# Patient Record
Sex: Female | Born: 1995 | Race: White | Hispanic: No | Marital: Single | Smoking: Current every day smoker
Health system: Southern US, Community
[De-identification: ages and names within clinical notes are randomized; demographics above are authoritative.]

## PROBLEM LIST (undated history)

## (undated) DIAGNOSIS — K219 Gastro-esophageal reflux disease without esophagitis: Secondary | ICD-10-CM

## (undated) DIAGNOSIS — E162 Hypoglycemia, unspecified: Secondary | ICD-10-CM

## (undated) DIAGNOSIS — J189 Pneumonia, unspecified organism: Secondary | ICD-10-CM

## (undated) DIAGNOSIS — B159 Hepatitis A without hepatic coma: Secondary | ICD-10-CM

## (undated) DIAGNOSIS — F909 Attention-deficit hyperactivity disorder, unspecified type: Secondary | ICD-10-CM

## (undated) DIAGNOSIS — G2581 Restless legs syndrome: Secondary | ICD-10-CM

## (undated) DIAGNOSIS — H9325 Central auditory processing disorder: Secondary | ICD-10-CM

## (undated) DIAGNOSIS — M797 Fibromyalgia: Secondary | ICD-10-CM

---
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
## 2013-04-28 ENCOUNTER — Encounter (HOSPITAL_BASED_OUTPATIENT_CLINIC_OR_DEPARTMENT_OTHER): Payer: Self-pay | Admitting: Emergency Medicine

## 2013-04-28 ENCOUNTER — Emergency Department (HOSPITAL_BASED_OUTPATIENT_CLINIC_OR_DEPARTMENT_OTHER)
Admission: EM | Admit: 2013-04-28 | Discharge: 2013-04-28 | Disposition: A | Discharge status: Home / Self Care | Payer: Medicaid Other | Attending: Emergency Medicine | Admitting: Emergency Medicine

## 2013-04-28 DIAGNOSIS — Z79899 Other long term (current) drug therapy: Secondary | ICD-10-CM | POA: Insufficient documentation

## 2013-04-28 DIAGNOSIS — Z87891 Personal history of nicotine dependence: Secondary | ICD-10-CM | POA: Insufficient documentation

## 2013-04-28 DIAGNOSIS — Z8619 Personal history of other infectious and parasitic diseases: Secondary | ICD-10-CM | POA: Insufficient documentation

## 2013-04-28 DIAGNOSIS — Z862 Personal history of diseases of the blood and blood-forming organs and certain disorders involving the immune mechanism: Secondary | ICD-10-CM | POA: Insufficient documentation

## 2013-04-28 DIAGNOSIS — N390 Urinary tract infection, site not specified: Secondary | ICD-10-CM | POA: Insufficient documentation

## 2013-04-28 DIAGNOSIS — Z8659 Personal history of other mental and behavioral disorders: Secondary | ICD-10-CM | POA: Insufficient documentation

## 2013-04-28 DIAGNOSIS — Z8719 Personal history of other diseases of the digestive system: Secondary | ICD-10-CM | POA: Insufficient documentation

## 2013-04-28 DIAGNOSIS — Z3202 Encounter for pregnancy test, result negative: Secondary | ICD-10-CM | POA: Insufficient documentation

## 2013-04-28 DIAGNOSIS — R059 Cough, unspecified: Secondary | ICD-10-CM | POA: Insufficient documentation

## 2013-04-28 DIAGNOSIS — Z792 Long term (current) use of antibiotics: Secondary | ICD-10-CM | POA: Insufficient documentation

## 2013-04-28 DIAGNOSIS — F411 Generalized anxiety disorder: Secondary | ICD-10-CM | POA: Insufficient documentation

## 2013-04-28 DIAGNOSIS — Z8639 Personal history of other endocrine, nutritional and metabolic disease: Secondary | ICD-10-CM | POA: Insufficient documentation

## 2013-04-28 DIAGNOSIS — R05 Cough: Secondary | ICD-10-CM | POA: Insufficient documentation

## 2013-04-28 DIAGNOSIS — Z8701 Personal history of pneumonia (recurrent): Secondary | ICD-10-CM | POA: Insufficient documentation

## 2013-04-28 DIAGNOSIS — G2581 Restless legs syndrome: Secondary | ICD-10-CM | POA: Insufficient documentation

## 2013-04-28 HISTORY — DX: Restless legs syndrome: G25.81

## 2013-04-28 LAB — URINALYSIS, ROUTINE W REFLEX MICROSCOPIC
BILIRUBIN URINE: NEGATIVE
Glucose, UA: NEGATIVE mg/dL
Hgb urine dipstick: NEGATIVE
KETONES UR: NEGATIVE mg/dL
Nitrite: NEGATIVE
PH: 6 (ref 5.0–8.0)
PROTEIN: NEGATIVE mg/dL
Specific Gravity, Urine: 1.03 (ref 1.005–1.030)
Urobilinogen, UA: 1 mg/dL (ref 0.0–1.0)

## 2013-04-28 LAB — URINE MICROSCOPIC-ADD ON

## 2013-04-28 LAB — PREGNANCY, URINE: Preg Test, Ur: NEGATIVE

## 2013-04-28 MED ORDER — LORAZEPAM 1 MG PO TABS: 1.0000 mg | ORAL_TABLET | Freq: Three times a day (TID) | ORAL | Status: DC | PRN

## 2013-04-28 MED ORDER — NITROFURANTOIN MONOHYD MACRO 100 MG PO CAPS
100.0000 mg | ORAL_CAPSULE | Freq: Once | ORAL | Status: AC
  Administered 2013-04-28: 100 mg via ORAL
  Filled 2013-04-28: qty 1

## 2013-04-28 MED ORDER — NITROFURANTOIN MONOHYD MACRO 100 MG PO CAPS: 100.0000 mg | ORAL_CAPSULE | Freq: Two times a day (BID) | ORAL | Status: DC

## 2013-04-28 NOTE — ED Notes (Signed)
Pt c/o restless arms/legs, unable to control movements, hx of same, states started tonight, denies pain

## 2013-04-28 NOTE — ED Provider Notes (Addendum)
CSN: [id number]     Arrival date & time 04/28/13  0536 History   None    Chief Complaint  Patient presents with  . Leg Problem    restless arms/legs     (Consider location/radiation/quality/duration/timing/severity/associated sxs/prior Treatment) HPI This is a 18 year old female with a history of restless leg syndrome. She has episodes that occur several times a year. These episodes occurred night primarily. It consists of an uncontrollable urge to move her arms and legs. This is associated with anxiety and a feeling of the knees. Her current episode began a day ago but worsened this morning. It is severe enough to keep her from being able to sleep. The symptoms are like previous episodes. She is not sure what triggers these episodes but states she is known to have a low iron level and is taking iron supplements. She has not had a ferritin level checked recently that she is aware of. She denies fever, chills, shortness of breath, nausea, vomiting, diarrhea, dysuria, vaginal bleeding or vaginal discharge. She was recently treated with hydrocodone/homatropine syrup for cough.  Past Medical History  Diagnosis Date  . Attention deficit hyperactivity disorder (ADHD)   . Hypoglycemia   . Central auditory processing disorder   . GERD (gastroesophageal reflux disease)   . Hepatitis A   . Pneumonia   . Restless leg syndrome    History reviewed. No pertinent past surgical history. No family history on file. History  Substance Use Topics  . Smoking status: Former Games developer  . Smokeless tobacco: Not on file  . Alcohol Use: No   OB History   Grav Para Term Preterm Abortions TAB SAB Ect Mult Living   1              Review of Systems  All other systems reviewed and are negative.   Allergies  Sulfa antibiotics  Home Medications   Current Outpatient Rx  Name  Route  Sig  Dispense  Refill  . ALPRAZolam (XANAX) 0.5 MG tablet   Oral   Take 0.5 mg by mouth 2 (two) times daily as needed for  anxiety.         . cephALEXin (KEFLEX) 500 MG capsule   Oral   Take 1 capsule (500 mg total) by mouth 4 (four) times daily.   40 capsule   0   . escitalopram (LEXAPRO) 20 MG tablet   Oral   Take 20 mg by mouth daily.         . metroNIDAZOLE (FLAGYL) 500 MG tablet   Oral   Take 1 tablet (500 mg total) by mouth 2 (two) times daily.   14 tablet   0   . naproxen (NAPROSYN) 500 MG tablet   Oral   Take 1 tablet (500 mg total) by mouth 2 (two) times daily.   30 tablet   0    BP 105/55  Pulse 76  Temp(Src) 99.8 F (37.7 C) (Oral)  Resp 18  SpO2 100%  LMP 04/14/2013  Breastfeeding? Unknown  Physical Exam General: Well-developed, well-nourished female in no acute distress; appearance consistent with age of record HENT: normocephalic; atraumatic Eyes: pupils equal, dilated, round and reactive to light; extraocular muscles intact Neck: supple Heart: regular rate and rhythm Lungs: clear to auscultation bilaterally Abdomen: soft; nondistended; nontender; no masses or hepatosplenomegaly; bowel sounds present Extremities: No deformity; full range of motion; pulses normal; no muscle tenderness; no muscle spasm palpated Neurologic: Awake, alert and oriented; motor function intact in all extremities and  symmetric; no facial droop; normal coordination, speech and gait Skin: Warm and dry Psychiatric: Normal mood and affect    ED Course  Procedures (including critical care time)  MDM   Nursing notes and vitals signs, including pulse oximetry, reviewed.  Summary of this visit's results, reviewed by myself:  Labs:  Results for orders placed during the hospital encounter of 04/28/13 (from the past 24 hour(s))  PREGNANCY, URINE     Status: None   Collection Time    04/28/13  6:01 AM      Result Value Ref Range   Preg Test, Ur NEGATIVE  NEGATIVE  URINALYSIS, ROUTINE W REFLEX MICROSCOPIC     Status: Abnormal   Collection Time    04/28/13  6:01 AM      Result Value Ref  Range   Color, Urine YELLOW  YELLOW   APPearance CLOUDY (*) CLEAR   Specific Gravity, Urine 1.030  1.005 - 1.030   pH 6.0  5.0 - 8.0   Glucose, UA NEGATIVE  NEGATIVE mg/dL   Hgb urine dipstick NEGATIVE  NEGATIVE   Bilirubin Urine NEGATIVE  NEGATIVE   Ketones, ur NEGATIVE  NEGATIVE mg/dL   Protein, ur NEGATIVE  NEGATIVE mg/dL   Urobilinogen, UA 1.0  0.0 - 1.0 mg/dL   Nitrite NEGATIVE  NEGATIVE   Leukocytes, UA SMALL (*) NEGATIVE  URINE MICROSCOPIC-ADD ON     Status: Abnormal   Collection Time    04/28/13  6:01 AM      Result Value Ref Range   Squamous Epithelial / LPF MANY (*) RARE   WBC, UA 7-10  <3 WBC/hpf   RBC / HPF 0-2  <3 RBC/hpf   Bacteria, UA MANY (*) RARE   Urine-Other MUCOUS PRESENT        [facility] the [facility] [place] Cont[place] Substances Database the patient has only had 2 prescriptions for benzodiazepines in the past year.   She has ever had a formal assessment for restless legs. Her symptoms may be exacerbated by her urinary tract infection. We will refer to neurology.    Hanley SeamenJohn L Laretta Pyatt, MD 04/28/13 [id number]  Hanley SeamenJohn L Jessalyn Hinojosa, MD 04/28/13 [id number]  Hanley SeamenJohn L Melena Hayes, MD 04/28/13 [phone]

## 2013-04-29 LAB — URINE CULTURE: Colony Count: 90000

## 2013-07-30 ENCOUNTER — Encounter (HOSPITAL_BASED_OUTPATIENT_CLINIC_OR_DEPARTMENT_OTHER): Payer: Self-pay | Admitting: Emergency Medicine

## 2013-07-30 ENCOUNTER — Emergency Department (HOSPITAL_BASED_OUTPATIENT_CLINIC_OR_DEPARTMENT_OTHER)
Admission: EM | Admit: 2013-07-30 | Discharge: 2013-07-30 | Disposition: A | Discharge status: Home / Self Care | Payer: Medicaid Other | Attending: Emergency Medicine | Admitting: Emergency Medicine

## 2013-07-30 DIAGNOSIS — K029 Dental caries, unspecified: Secondary | ICD-10-CM | POA: Insufficient documentation

## 2013-07-30 DIAGNOSIS — Z87891 Personal history of nicotine dependence: Secondary | ICD-10-CM | POA: Diagnosis not present

## 2013-07-30 DIAGNOSIS — K089 Disorder of teeth and supporting structures, unspecified: Secondary | ICD-10-CM | POA: Diagnosis present

## 2013-07-30 DIAGNOSIS — Z792 Long term (current) use of antibiotics: Secondary | ICD-10-CM | POA: Diagnosis not present

## 2013-07-30 DIAGNOSIS — Z8669 Personal history of other diseases of the nervous system and sense organs: Secondary | ICD-10-CM | POA: Insufficient documentation

## 2013-07-30 DIAGNOSIS — Z8701 Personal history of pneumonia (recurrent): Secondary | ICD-10-CM | POA: Diagnosis not present

## 2013-07-30 DIAGNOSIS — Z8619 Personal history of other infectious and parasitic diseases: Secondary | ICD-10-CM | POA: Insufficient documentation

## 2013-07-30 DIAGNOSIS — Z79899 Other long term (current) drug therapy: Secondary | ICD-10-CM | POA: Insufficient documentation

## 2013-07-30 DIAGNOSIS — F909 Attention-deficit hyperactivity disorder, unspecified type: Secondary | ICD-10-CM | POA: Insufficient documentation

## 2013-07-30 DIAGNOSIS — Z791 Long term (current) use of non-steroidal anti-inflammatories (NSAID): Secondary | ICD-10-CM | POA: Insufficient documentation

## 2013-07-30 DIAGNOSIS — K0889 Other specified disorders of teeth and supporting structures: Secondary | ICD-10-CM

## 2013-07-30 MED ORDER — NAPROXEN 500 MG PO TABS: 500.0000 mg | ORAL_TABLET | Freq: Two times a day (BID) | ORAL | Status: DC

## 2013-07-30 MED ORDER — HYDROCODONE-ACETAMINOPHEN 5-325 MG PO TABS
1.0000 | ORAL_TABLET | Freq: Four times a day (QID) | ORAL | Status: DC | PRN
Start: 2013-07-30 — End: 2018-03-17

## 2013-07-30 MED ORDER — PENICILLIN V POTASSIUM 500 MG PO TABS: 500.0000 mg | ORAL_TABLET | Freq: Four times a day (QID) | ORAL | Status: DC

## 2013-07-30 NOTE — ED Notes (Signed)
Introduced myself to patient. Waiting on MD.

## 2013-07-30 NOTE — ED Provider Notes (Signed)
CSN: [id number]     Arrival date & time 07/30/13  1811 History  This chart was scribed for Vanetta MuldersScott Jaycie Kregel, MD by Evon Slackerrance Branch, ED Scribe. This patient was seen in room MH11/MH11 and the patient's care was started at 9:10 PM.     Chief Complaint  Patient presents with  . Dental Pain   Patient is a 18 y.o. female presenting with tooth pain. The history is provided by the patient. No language interpreter was used.  Dental Pain Location:  Upper Quality:  Throbbing Severity:  Moderate Onset quality:  Gradual Duration:  2 days Timing:  Constant Context: dental caries   Relieved by:  None tried Worsened by:  Nothing tried Ineffective treatments:  None tried Associated symptoms: no fever, no headaches and no neck pain    HPI Comments: Ariel Spears is a 18 y.o. female who presents to the Emergency Department complaining of throbbing upper dental pain onset 2 days prior. She states she hasn't taken any medication prior to arrival. She states that her pain severity is 9/10. She states she has some associated chills. She states she has a dental appointment Monday. She denies fever chills, rhinorrhea, cough, sore throat, visual disturbance, SOB, chest pain, nausea, vomiting, abdominal pain, diarrhea, neck pain, back pain, or headaches.      Past Medical History  Diagnosis Date  . Attention deficit hyperactivity disorder (ADHD)   . Hypoglycemia   . Central auditory processing disorder   . GERD (gastroesophageal reflux disease)   . Hepatitis A   . Pneumonia   . Restless leg syndrome    History reviewed. No pertinent past surgical history. No family history on file. History  Substance Use Topics  . Smoking status: Former Games developermoker  . Smokeless tobacco: Not on file  . Alcohol Use: No   OB History   Grav Para Term Preterm Abortions TAB SAB Ect Mult Living   1              Review of Systems  Constitutional: Positive for chills. Negative for fever.  HENT: Negative for  rhinorrhea and sore throat.   Eyes: Negative for visual disturbance.  Respiratory: Negative for cough and shortness of breath.   Cardiovascular: Negative for chest pain.  Gastrointestinal: Negative for nausea, vomiting, abdominal pain and diarrhea.  Musculoskeletal: Negative for back pain and neck pain.  Neurological: Negative for headaches.  Psychiatric/Behavioral: Negative for confusion.    Allergies  Sulfa antibiotics  Home Medications   Prior to Admission medications   Medication Sig Start Date End Date Taking? Authorizing Provider  Lisdexamfetamine Dimesylate (VYVANSE PO) Take by mouth.   Yes Historical Provider, MD  ALPRAZolam Prudy Feeler(XANAX) 0.5 MG tablet Take 0.5 mg by mouth 2 (two) times daily as needed for anxiety.    Historical Provider, MD  cephALEXin (KEFLEX) 500 MG capsule Take 1 capsule (500 mg total) by mouth 4 (four) times daily. 09/06/12   Marissa Sciacca, PA-C  escitalopram (LEXAPRO) 20 MG tablet Take 20 mg by mouth daily.    Historical Provider, MD  HYDROcodone-acetaminophen (NORCO/VICODIN) 5-325 MG per tablet Take 1-2 tablets by mouth every 6 (six) hours as needed for moderate pain. 07/30/13   Vanetta MuldersScott Lashunta Frieden, MD  LORazepam (ATIVAN) 1 MG tablet Take 1 tablet (1 mg total) by mouth 3 (three) times daily as needed (for anxiety or restless legs). 04/28/13   John L Molpus, MD  metroNIDAZOLE (FLAGYL) 500 MG tablet Take 1 tablet (500 mg total) by mouth 2 (two) times daily. 12/17/12  Elenora GammaSamuel L Bradshaw, MD  naproxen (NAPROSYN) 500 MG tablet Take 1 tablet (500 mg total) by mouth 2 (two) times daily. 09/06/12   Marissa Sciacca, PA-C  naproxen (NAPROSYN) 500 MG tablet Take 1 tablet (500 mg total) by mouth 2 (two) times daily. 07/30/13   Vanetta MuldersScott Krew Hortman, MD  nitrofurantoin, macrocrystal-monohydrate, (MACROBID) 100 MG capsule Take 1 capsule (100 mg total) by mouth 2 (two) times daily. X 7 days 04/28/13   Carlisle BeersJohn L Molpus, MD  penicillin v potassium (VEETID) 500 MG tablet Take 1 tablet (500 mg total) by  mouth 4 (four) times daily. 07/30/13   Vanetta MuldersScott Fabricio Endsley, MD   Triage Vitals: BP 112/76  Pulse 96  Temp(Src) 98.3 F (36.8 C) (Oral)  Resp 16  Ht 5' 7.5" (1.715 m)  Wt 140 lb (63.504 kg)  BMI 21.59 kg/m2  SpO2 99%  LMP 07/16/2013  Physical Exam  Nursing note and vitals reviewed. Constitutional: She is oriented to person, place, and time. She appears well-developed and well-nourished. No distress.  HENT:  Head: Normocephalic and atraumatic.  Mouth/Throat: Dental caries present.  Dental carie in last molar on upper left side  Eyes: Conjunctivae and EOM are normal.  Neck: Neck supple. No tracheal deviation present.  Cardiovascular: Normal rate, regular rhythm and normal heart sounds.   Pulmonary/Chest: Effort normal and breath sounds normal. No respiratory distress.  Abdominal: Bowel sounds are normal. There is no tenderness.  Musculoskeletal: Normal range of motion.  Neurological: She is alert and oriented to person, place, and time. No cranial nerve deficit. She exhibits normal muscle tone. Coordination normal.  Skin: Skin is warm and dry.  Psychiatric: She has a normal mood and affect. Her behavior is normal.    ED Course  Procedures (including critical care time) DIAGNOSTIC STUDIES: Oxygen Saturation is 99% on RA, normal by my interpretation.    COORDINATION OF CARE: 9:16 PM-Discussed treatment plan which includes pain medication with pt at bedside and pt agreed to plan.     Labs Review Labs Reviewed - No data to display  Imaging Review No results found.   EKG Interpretation None      MDM   Final diagnoses:  Toothache    Patient with left upper molar with a lot of decay. The tenderness to palpation. Will treat with antibiotic anti-inflammatory and pain medicine and followup with dentist. Patient is nontoxic no acute distress.    I personally performed the services described in this documentation, which was scribed in my presence. The recorded information has  been reviewed and is accurate.       Vanetta MuldersScott Ashyia Schraeder, MD 07/30/13 2121

## 2013-07-30 NOTE — ED Notes (Signed)
MD at bedside. 

## 2013-07-30 NOTE — ED Notes (Signed)
Dental pain since yesterday.  

## 2013-07-30 NOTE — Discharge Instructions (Signed)
Dental Pain Toothache is pain in or around a tooth. It may get worse with chewing or with cold or heat.  HOME CARE  Your dentist may use a numbing medicine during treatment. If so, you may need to avoid eating until the medicine wears off. Ask your dentist about this.  Only take medicine as told by your dentist or doctor.  Avoid chewing food near the painful tooth until after all treatment is done. Ask your dentist about this. GET HELP RIGHT AWAY IF:   The problem gets worse or new problems appear.  You have a fever.  There is redness and puffiness (swelling) of the face, jaw, or neck.  You cannot open your mouth.  There is pain in the jaw.  There is very bad pain that is not helped by medicine. MAKE SURE YOU:   Understand these instructions.  Will watch your condition.  Will get help right away if you are not doing well or get worse. Document Released: 07/03/2007 Document Revised: 04/08/2011 Document Reviewed: 07/03/2007 [facility] Patient Information 2015 [employer] [place]. This information is not intended to replace advice given to you by your health care provider. Make sure you discuss any questions you have with your health care provider. Take medications as directed. Followup with dentist next week. Return for any new or worse symptoms.

## 2013-10-20 ENCOUNTER — Emergency Department (HOSPITAL_COMMUNITY)
Admission: EM | Admit: 2013-10-20 | Discharge: 2013-10-20 | Disposition: A | Discharge status: Home / Self Care | Payer: Medicaid Other | Attending: Emergency Medicine | Admitting: Emergency Medicine

## 2013-10-20 ENCOUNTER — Encounter (HOSPITAL_COMMUNITY): Payer: Self-pay | Admitting: Emergency Medicine

## 2013-10-20 DIAGNOSIS — Z87891 Personal history of nicotine dependence: Secondary | ICD-10-CM | POA: Insufficient documentation

## 2013-10-20 DIAGNOSIS — Z8701 Personal history of pneumonia (recurrent): Secondary | ICD-10-CM | POA: Diagnosis not present

## 2013-10-20 DIAGNOSIS — F411 Generalized anxiety disorder: Secondary | ICD-10-CM | POA: Insufficient documentation

## 2013-10-20 DIAGNOSIS — Z8669 Personal history of other diseases of the nervous system and sense organs: Secondary | ICD-10-CM | POA: Insufficient documentation

## 2013-10-20 DIAGNOSIS — Z8619 Personal history of other infectious and parasitic diseases: Secondary | ICD-10-CM | POA: Diagnosis not present

## 2013-10-20 DIAGNOSIS — Z862 Personal history of diseases of the blood and blood-forming organs and certain disorders involving the immune mechanism: Secondary | ICD-10-CM | POA: Diagnosis not present

## 2013-10-20 DIAGNOSIS — F19939 Other psychoactive substance use, unspecified with withdrawal, unspecified: Principal | ICD-10-CM | POA: Insufficient documentation

## 2013-10-20 DIAGNOSIS — F1123 Opioid dependence with withdrawal: Secondary | ICD-10-CM

## 2013-10-20 DIAGNOSIS — F909 Attention-deficit hyperactivity disorder, unspecified type: Secondary | ICD-10-CM

## 2013-10-20 DIAGNOSIS — Z8639 Personal history of other endocrine, nutritional and metabolic disease: Secondary | ICD-10-CM | POA: Insufficient documentation

## 2013-10-20 DIAGNOSIS — Z791 Long term (current) use of non-steroidal anti-inflammatories (NSAID): Secondary | ICD-10-CM | POA: Insufficient documentation

## 2013-10-20 DIAGNOSIS — F1120 Opioid dependence, uncomplicated: Secondary | ICD-10-CM | POA: Insufficient documentation

## 2013-10-20 DIAGNOSIS — Z79899 Other long term (current) drug therapy: Secondary | ICD-10-CM | POA: Insufficient documentation

## 2013-10-20 DIAGNOSIS — Z792 Long term (current) use of antibiotics: Secondary | ICD-10-CM | POA: Insufficient documentation

## 2013-10-20 DIAGNOSIS — Z8719 Personal history of other diseases of the digestive system: Secondary | ICD-10-CM | POA: Insufficient documentation

## 2013-10-20 MED ORDER — DICYCLOMINE HCL 20 MG PO TABS: 20.0000 mg | ORAL_TABLET | Freq: Two times a day (BID) | ORAL | Status: DC

## 2013-10-20 MED ORDER — ONDANSETRON HCL 4 MG PO TABS: 4.0000 mg | ORAL_TABLET | Freq: Four times a day (QID) | ORAL | Status: DC

## 2013-10-20 MED ORDER — CLONIDINE HCL 0.2 MG PO TABS: 0.2000 mg | ORAL_TABLET | Freq: Three times a day (TID) | ORAL | Status: DC

## 2013-10-20 NOTE — ED Provider Notes (Signed)
Medical screening examination/treatment/procedure(s) were performed by non-physician practitioner and as supervising physician I was immediately available for consultation/collaboration.   EKG Interpretation None      Mertha Clyatt, MD, ADevoria Albe Gang   Ward Givens, MD 10/20/13 [phone]

## 2013-10-20 NOTE — ED Notes (Signed)
Pt reports at one point she was taking pain pills for her tooth, also takes vyvanse, reports she felt normal taking vyanse and pain pills. Pt started taking suboxone 3 weeks ago to make herself feel normal. Pt wants to stop doing suboxone. Denies pain. Reports during withdrawal she starts getting sweaty and cold. Last suboxone use around 12pm.

## 2013-10-20 NOTE — Discharge Instructions (Signed)
Opioid Use Disorder °Opioid use disorder is a mental disorder. It is the continued nonmedical use of opioids in spite of risks to health and well-being. Misused opioids include the street drug heroin. They also include pain medicines such as morphine, hydrocodone, oxycodone, and fentanyl. Opioids are very addictive. People who misuse opioids get an exaggerated feeling of well-being. Opioid use disorder often disrupts activities at home, work, or school. It may cause mental or physical problems.  °A family history of opioid use disorder puts you at higher risk of it. People with opioid use disorder often misuse other drugs or have mental illness such as depression, posttraumatic stress disorder, or antisocial personality disorder. They also are at risk of suicide and death from overdose. °SIGNS AND SYMPTOMS  °Signs and symptoms of opioid use disorder include: °· Use of opioids in larger amounts or over a longer period than intended. °· Unsuccessful attempts to cut down or control opioid use. °· A lot of time spent obtaining, using, or recovering from the effects of opioids. °· A strong desire or urge to use opioids (craving). °· Continued use of opioids in spite of major problems at work, school, or home because of use. °· Continued use of opioids in spite of relationship problems because of use. °· Giving up or cutting down on important life activities because of opioid use. °· Use of opioids over and over in situations when it is physically hazardous, such as driving a car. °· Continued use of opioids in spite of a physical problem that is likely related to use. Physical problems can include: °¨ Severe constipation. °¨ Poor nutrition. °¨ Infertility. °¨ Tuberculosis. °¨ Aspiration pneumonia. °¨ Infections such as human immunodeficiency virus (HIV) and hepatitis (from injecting opioids). °· Continued use of opioids in spite of a mental problem that is likely related to use. Mental problems can  include: °¨ Depression. °¨ Anxiety. °¨ Hallucinations. °¨ Sleep problems. °¨ Loss of sexual function. °· Need to use more and more opioids to get the same effect, or lessened effect over time with use of the same amount (tolerance). °· Having withdrawal symptoms when opioid use is stopped, or using opioids to reduce or avoid withdrawal symptoms. Withdrawal symptoms include: °¨ Depressed, anxious, or irritable mood. °¨ Nausea, vomiting, diarrhea, or intestinal cramping. °¨ Muscle aches or spasms. °¨ Excessive tearing or runny nose. °¨ Dilated pupils, sweating, or hairs standing on end. °¨ Yawning. °¨ Fever, raised blood pressure, or fast pulse. °¨ Restlessness or trouble sleeping. This does not apply to people taking opioids for medical reasons only. °DIAGNOSIS °Opioid use disorder is diagnosed by your health care provider. You may be asked questions about your opioid use and and how it affects your life. A physical exam may be done. A drug screen may be ordered. You may be referred to a mental health professional. The diagnosis of opioid use disorder requires at least two symptoms within 12 months. The type of opioid use disorder you have depends on the number of signs and symptoms you have. The type may be: °· Mild. Two or three signs and symptoms.    °· Moderate. Four or five signs and symptoms.   °· Severe. Six or more signs and symptoms. °TREATMENT  °Treatment is usually provided by mental health professionals with training in substance use disorders. The following options are available: °· Detoxification. This is the first step in treatment for withdrawal. It is medically supervised withdrawal with the use of medicines. These medicines lessen withdrawal symptoms. They also raise the chance   of becoming opioid free.  Counseling, also known as talk therapy. Talk therapy addresses the reasons you use opioids. It also addresses ways to keep you from using again (relapse). The goals of talk therapy are to avoid  relapse by:  Identifying and avoiding triggers for use.  Finding healthy ways to cope with stress.  Learning how to handle cravings.  Support groups. Support groups provide emotional support, advice, and guidance.  A medicine that blocks opioid receptors in your brain. This medicine can reduce opioid cravings that lead to relapse. This medicine also blocks the desired opioid effect when relapse occurs.  Opioids that are taken by mouth in place of the misused opioid (opioid maintenance treatment). These medicines satisfy cravings but are safer than commonly misused opioids. This often is the best option for people who continue to relapse with other treatments. HOME CARE INSTRUCTIONS   Take medicines only as directed by your health care provider.  Check with your health care provider before starting new medicines.  Keep all follow-up visits as directed by your health care provider. SEEK MEDICAL CARE IF:  You are not able to take your medicines as directed.  Your symptoms get worse. SEEK IMMEDIATE MEDICAL CARE IF:  You have serious thoughts about hurting yourself or others.  You may have taken an overdose of opioids. FOR MORE INFORMATION  National Institute on Drug Abuse: http://www.price-smith.com/  Substance Abuse and Mental Health Services Administration: SkateOasis.com.pt Document Released: 11/11/2006 Document Revised: 05/31/2013 Document Reviewed: 01/27/2013 [facility] Patient Information 2015 [place], [place]. This information is not intended to replace advice given to you by your health care provider. Make sure you discuss any questions you have with your health care provider.   Finding Treatment for Alcohol and Drug Addiction It can be hard to find the right place to get professional treatment. Here are some important things to consider:  There are different types of treatment to choose from.  Some programs are live-in (residential) while others are not (outpatient). Sometimes a  combination is offered.  No single type of program is right for everyone.  Most treatment programs involve a combination of education, counseling, and a 12-step, spiritually-based approach.  There are non-spiritually based programs (not 12-step).  Some treatment programs are government sponsored. They are geared for patients without private insurance.  Treatment programs can vary in many respects such as:  Cost and types of insurance accepted.  Types of on-site medical services offered.  Length of stay, setting, and size.  Overall philosophy of treatment. A person may need specialized treatment or have needs not addressed by all programs. For example, adolescents need treatment appropriate for their age. Other people have secondary disorders that must be managed as well. Secondary conditions can include mental illness, such as depression or diabetes. Often, a period of detoxification from alcohol or drugs is needed. This requires medical supervision and not all programs offer this. THINGS TO CONSIDER WHEN SELECTING A TREATMENT PROGRAM   Is the program certified by the appropriate government agency? Even private programs must be certified and employ certified professionals.  Does the program accept your insurance? If not, can a payment plan be set up?  Is the facility clean, organized, and well run? Do they allow you to speak with graduates who can share their treatment experience with you? Can you tour the facility? Can you meet with staff?  Does the program meet the full range of individual needs?  Does the treatment program address sexual orientation and physical disabilities? Do  they provide age, gender, and culturally appropriate treatment services?  Is treatment available in languages other than English?  Is long-term aftercare support or guidance encouraged and provided?  Is assessment of an individual's treatment plan ongoing to ensure it meets changing needs?  Does the  program use strategies to encourage reluctant patients to remain in treatment long enough to increase the likelihood of success?  Does the program offer counseling (individual or group) and other behavioral therapies?  Does the program offer medicine as part of the treatment regimen, if needed?  Is there ongoing monitoring of possible relapse? Is there a defined relapse prevention program? Are services or referrals offered to family members to ensure they understand addiction and the recovery process? This would help them support the recovering individual.  Are 12-step meetings held at the center or is transport available for patients to attend outside meetings? In countries outside of the [place]. and [place], Magazine features editor for contact information for services in your area. Document Released: 12/13/2004 Document Revised: 04/08/2011 Document Reviewed: 06/25/2007 [facility] Patient Information 2015 [place], [place]. This information is not intended to replace advice given to you by your health care provider. Make sure you discuss any questions you have with your health care provider.

## 2013-10-20 NOTE — ED Provider Notes (Signed)
CSN: [id number]     Arrival date & time 10/20/13  1401 History  This chart was scribed for non-physician practitioner, Eben Burow, PA-C, working with Ward Givens, MD by Charline Bills, ED Scribe. This patient was seen in room WTR4/WLPT4 and the patient's care was started at 3:59 PM.   Chief Complaint  Patient presents with  . detox suboxone    The history is provided by the patient. No language interpreter was used.   HPI Comments: Ariel Spears is a 18 y.o. female, with a h/o ADHD, hypoglycemia, hepatitis A, who presents to the Emergency Department for detox from Suboxone. Pt states that she was initially taking pain medication for dental pain. Pt states that she was also taking Vyvanse for ADHD which caused her to feel hot and BP elevation. Pt states that she tried Suboxone with Vyvanse one day and that seemed to relieve her symptoms. Pt states that she has used Suboxone for the past 3 weeks, but not for pain. She expresses her desire to detox. Last dose at 12 PM today. Pt reports associated chills and diaphoresis. She denies diarrhea, nausea, vomiting, SOB.  PCP: Fran Lowes  Past Medical History  Diagnosis Date  . Attention deficit hyperactivity disorder (ADHD)   . Hypoglycemia   . Central auditory processing disorder   . GERD (gastroesophageal reflux disease)   . Hepatitis A   . Pneumonia   . Restless leg syndrome    History reviewed. No pertinent past surgical history. History reviewed. No pertinent family history. History  Substance Use Topics  . Smoking status: Former Games developer  . Smokeless tobacco: Not on file  . Alcohol Use: No   OB History   Grav Para Term Preterm Abortions TAB SAB Ect Mult Living   1              Review of Systems  Constitutional: Positive for chills and diaphoresis.  Respiratory: Negative for shortness of breath.   Gastrointestinal: Negative for nausea, vomiting and diarrhea.  All other systems reviewed and are  negative.  Allergies  Sulfa antibiotics  Home Medications   Prior to Admission medications   Medication Sig Start Date End Date Taking? Authorizing Provider  ALPRAZolam Prudy Feeler) 0.5 MG tablet Take 0.5 mg by mouth 2 (two) times daily as needed for anxiety.    Historical Provider, MD  cephALEXin (KEFLEX) 500 MG capsule Take 1 capsule (500 mg total) by mouth 4 (four) times daily. 09/06/12   Marissa Sciacca, PA-C  cloNIDine (CATAPRES) 0.2 MG tablet Take 1 tablet (0.2 mg total) by mouth 3 (three) times daily. 10/20/13   Dayn Barich A Forcucci, PA-C  dicyclomine (BENTYL) 20 MG tablet Take 1 tablet (20 mg total) by mouth 2 (two) times daily. 10/20/13   Irby Fails A Forcucci, PA-C  escitalopram (LEXAPRO) 20 MG tablet Take 20 mg by mouth daily.    Historical Provider, MD  HYDROcodone-acetaminophen (NORCO/VICODIN) 5-325 MG per tablet Take 1-2 tablets by mouth every 6 (six) hours as needed for moderate pain. 07/30/13   Vanetta Mulders, MD  Lisdexamfetamine Dimesylate (VYVANSE PO) Take by mouth.    Historical Provider, MD  LORazepam (ATIVAN) 1 MG tablet Take 1 tablet (1 mg total) by mouth 3 (three) times daily as needed (for anxiety or restless legs). 04/28/13   John L Molpus, MD  metroNIDAZOLE (FLAGYL) 500 MG tablet Take 1 tablet (500 mg total) by mouth 2 (two) times daily. 12/17/12   Elenora Gamma, MD  naproxen (NAPROSYN) 500 MG tablet  Take 1 tablet (500 mg total) by mouth 2 (two) times daily. 09/06/12   Marissa Sciacca, PA-C  naproxen (NAPROSYN) 500 MG tablet Take 1 tablet (500 mg total) by mouth 2 (two) times daily. 07/30/13   Vanetta Mulders, MD  nitrofurantoin, macrocrystal-monohydrate, (MACROBID) 100 MG capsule Take 1 capsule (100 mg total) by mouth 2 (two) times daily. X 7 days 04/28/13   Carlisle Beers Molpus, MD  ondansetron (ZOFRAN) 4 MG tablet Take 1 tablet (4 mg total) by mouth every 6 (six) hours. 10/20/13   Kirubel Aja A Forcucci, PA-C  penicillin v potassium (VEETID) 500 MG tablet Take 1 tablet (500 mg total) by  mouth 4 (four) times daily. 07/30/13   Vanetta Mulders, MD   Triage Vitals: BP 135/74  Pulse 113  Temp(Src) 98.9 F (37.2 C) (Oral)  Resp 16  SpO2 100% Physical Exam  Nursing note and vitals reviewed. Constitutional: She is oriented to person, place, and time. She appears well-developed and well-nourished.  HENT:  Head: Normocephalic and atraumatic.  Nose: Nose normal.  Mouth/Throat: Oropharynx is clear and moist. No oropharyngeal exudate.  Eyes: Conjunctivae and EOM are normal. Pupils are equal, round, and reactive to light.  Neck: Neck supple.  Cardiovascular: Normal rate and regular rhythm.  Exam reveals no gallop and no friction rub.   No murmur heard. Pulmonary/Chest: Effort normal and breath sounds normal.  Musculoskeletal: Normal range of motion.  Neurological: She is alert and oriented to person, place, and time. No cranial nerve deficit or sensory deficit.  Skin: Skin is warm and dry.  Psychiatric: Her mood appears anxious. Her speech is rapid and/or pressured. She is hyperactive. Thought content is not paranoid and not delusional. Cognition and memory are not impaired. She expresses impulsivity. She expresses no homicidal and no suicidal ideation. She expresses no suicidal plans and no homicidal plans.   ED Course  Procedures (including critical care time) DIAGNOSTIC STUDIES: Oxygen Saturation is 100% on RA, normal by my interpretation.    COORDINATION OF CARE: 4:06 PM-Discussed treatment plan which includes anti-nausea medication and out-patient references with pt at bedside and pt agreed to plan.   Labs Review Labs Reviewed - No data to display  Imaging Review No results found.   EKG Interpretation None      MDM   Final diagnoses:  Attention deficit hyperactivity disorder (ADHD), unspecified ADHD type  Opioid dependence with withdrawal   Patient is a 18 y.o. Female who presents to the ED with desire to detox from suboxone.  Patient has stable vitals and  normal physical exam here.  Will give the patient clonidine, bentyl, and zofran for withdrawal symptoms and will have the patient follow-up with outpatient resources for detox.  Patient is stable at this time for discharge.  Patient to follow-up with [facility] and her PCP for adjustments in her medications.  Patient states understanding and agreement.  I personally performed the services described in this documentation, which was scribed in my presence. The recorded information has been reviewed and is accurate.    Eben Burow, PA-C 10/20/13 1612

## 2013-10-25 ENCOUNTER — Ambulatory Visit (HOSPITAL_COMMUNITY)
Admission: AD | Admit: 2013-10-25 | Discharge: 2013-10-25 | Disposition: A | Discharge status: Home / Self Care | Payer: Medicaid Other | Source: Intra-hospital | Attending: Psychiatry | Admitting: Psychiatry

## 2013-10-25 NOTE — BH Assessment (Signed)
Assessment Note  Ariel Spears is an 18 y.o. female who presented to [facility] assessment seeking support.  She reports she had been crying and depressed all day and didn't know what else to do.  She states she has an appointment with a psychiatrist on [address] on Monday and an appointment with someone else in their office, presumably a therapist, later that week, but didn't know what to do until then.  She states that for a short period of time she was using suboxone to cope with her depression because it was the only thing that brought her immediate relief.  However, she has not been using it recently and wants to remain clean and wasn't sure what else to do.  She presents somewhat unkempt and reports decreased grooming, more time in bed-attempting to sleep away her feelings.  She also reports increased anxiety with monthly panic attacks, tearfulness, feelings of worthlessness, anhedonia, isolating behavior, fatigue, and despondency.  She denies SI stating she has felt that way in the past, but realizes now that it would do no good and will not even entertain such thoughts.  She also denies HI or AVH.    Ariel Spears reports her depression started after her son was born 5 years ago.  She states she had sort of an outgoing partying life, but got pregnant and quit drinking.  She reprots she wasn't sad about being a mother, but was just sad in general all of the time.  She later started antidepressants and vyvanse, and found some relief, but found that the vyvanse made her feel really badly-she reports it made her face hot and her blood pressure high-so she quit taking it, and her other medications and has fallen in to a slump she cannot get out of.  She is able to get her son to and from school, but cannot make herself do anything else.  She would like to get a job one day  And open her own Deal[employer].    She is able to contract for safety and looks forward to her future.  We discussed ways she might  combat her depression while she awaits her appointment on Monday and she was given referrals to the wellness academy and support groups through [facility].  In addition, we discussed that she is able to do things for her son and doesn't want to let him down, so she will go home and tell him that she is having lunch with him tomorrow to motivate her to stay out of bed and hopefully talk to his teacher about volunteering in the classroom.  Consulted with Claudette Head, [facility] NP who is in agreement with the disposition to follow through with her current plan for outpatient treatment.  She declined an MSE and signed the appropriate form.  She signed a no harm contract and was provided with instructions to call [facility] or go to the nearest emergency room if she could not keep herself safe.  There are no weapons in the home and she lives with her mother.    Axis I: Major Depression, Recurrent severe Axis II: Deferred Axis III:  Past Medical History  Diagnosis Date  . Attention deficit hyperactivity disorder (ADHD)   . Hypoglycemia   . Central auditory processing disorder   . GERD (gastroesophageal reflux disease)   . Hepatitis A   . Pneumonia   . Restless leg syndrome    Axis IV: economic problems, occupational problems and problems with access to health care services  Axis V: 51-60 moderate symptoms  Past Medical History:  Past Medical History  Diagnosis Date  . Attention deficit hyperactivity disorder (ADHD)   . Hypoglycemia   . Central auditory processing disorder   . GERD (gastroesophageal reflux disease)   . Hepatitis A   . Pneumonia   . Restless leg syndrome     No past surgical history on file.  Family History: No family history on file.  Social History:  reports that she has quit smoking. She does not have any smokeless tobacco history on file. She reports that she does not drink alcohol or use illicit drugs.  Additional Social History:  Alcohol / Drug Use Longest period of sobriety  (when/how long): 5 years  CIWA:   COWS:    Allergies:  Allergies  Allergen Reactions  . Sulfa Antibiotics Rash    Home Medications:  (Not in a hospital admission)  OB/GYN Status:  No LMP recorded. Patient is not currently having periods (Reason: Oral contraceptives).  General Assessment Data Location of Assessment: [facility] Is this a Tele or Face-to-Face Assessment?: Face-to-Face Is this an Initial Assessment or a Re-assessment for this encounter?: Initial Assessment Living Arrangements: Parent;Children (mother and 68 yo son) Can pt return to current living arrangement?: Yes Admission Status: Voluntary Is patient capable of signing voluntary admission?: Yes Transfer from: Acute Hospital Referral Source: Self/Family/Friend     [facility] Crisis Care Plan Living Arrangements: Parent;Children (mother and 76 yo son)  Education Status Is patient currently in school?: No Highest grade of school patient has completed: some college  Risk to self with the past 6 months Suicidal Ideation: No Suicidal Intent: No Is patient at risk for suicide?: No Suicidal Plan?: No Access to Means: No What has been your use of drugs/alcohol within the last 12 months?: has used some opiates to cope with depression in the past Previous Attempts/Gestures: No How many times?: 0 Intentional Self Injurious Behavior: None Family Suicide History: No Recent stressful life event(s): Other (Comment) (persistant pervasive depression) Persecutory voices/beliefs?: No Depression: Yes Depression Symptoms: Despondent;Insomnia;Isolating;Tearfulness;Fatigue;Guilt;Loss of interest in usual pleasures;Feeling worthless/self pity;Feeling angry/irritable Substance abuse history and/or treatment for substance abuse?: No Suicide prevention information given to non-admitted patients: Yes  Risk to Others within the past 6 months Homicidal Ideation: No Thoughts of Harm to Others: No Current Homicidal Intent:  No Current Homicidal Plan: No Access to Homicidal Means: No History of harm to others?: No Assessment of Violence: None Noted Does patient have access to weapons?: No Criminal Charges Pending?: No Does patient have a court date: No  Psychosis Hallucinations: None noted Delusions: None noted  Mental Status Report Appear/Hygiene: Disheveled Eye Contact: Good Motor Activity: Freedom of movement Speech: Logical/coherent Level of Consciousness: Alert Mood: Depressed Affect: Appropriate to circumstance;Depressed Anxiety Level: Severe Thought Processes: Coherent;Relevant Judgement: Unimpaired Orientation: Place;Person;Time;Situation Obsessive Compulsive Thoughts/Behaviors: Moderate  Cognitive Functioning Concentration: Decreased Memory: Recent Intact;Remote Intact IQ: Average Insight: Good Impulse Control: Good Appetite: Fair Weight Loss: 0 Weight Gain: 0 Sleep: Increased Total Hours of Sleep: 10 (wakeful) Vegetative Symptoms: Staying in bed;Decreased grooming  ADLScreening [facility] Assessment Services) Patient's cognitive ability adequate to safely complete daily activities?: Yes Patient able to express need for assistance with ADLs?: Yes Independently performs ADLs?: Yes (appropriate for developmental age)  Prior Inpatient Therapy Prior Inpatient Therapy: Yes Prior Therapy Dates: years ago Reason for Treatment: Depression, SI  Prior Outpatient Therapy Prior Outpatient Therapy: Yes Prior Therapy Dates: can't remember Prior Therapy Facilty/Provider(s): can't remember  ADL Screening (condition at  time of admission) Patient's cognitive ability adequate to safely complete daily activities?: Yes Patient able to express need for assistance with ADLs?: Yes Independently performs ADLs?: Yes (appropriate for developmental age)       Abuse/Neglect Assessment (Assessment to be complete while patient is alone) Physical Abuse: Denies Verbal Abuse: Denies Sexual Abuse:  Denies Exploitation of patient/patient's resources: Denies Self-Neglect: Denies     Merchant navy officer (For Healthcare) Does patient have an advance directive?: No Would patient like information on creating an advanced directive?: No - patient declined information Nutrition Screen- [facility] Adult/WL/AP Patient's home diet: Regular  Additional Information 1:1 In Past 12 Months?: No CIRT Risk: No Elopement Risk: No Does patient have medical clearance?: Yes     Disposition:  Disposition Initial Assessment Completed for this Encounter: Yes Disposition of Patient: Referred to Patient referred to: Other (Comment) (has appointment with a psychiatrist on Monday and was given )  On Site Evaluation by:   Reviewed with Physician:    Steward Ros 10/25/2013 6:50 PM

## 2013-10-26 ENCOUNTER — Emergency Department (HOSPITAL_BASED_OUTPATIENT_CLINIC_OR_DEPARTMENT_OTHER)
Admission: EM | Admit: 2013-10-26 | Discharge: 2013-10-26 | Disposition: A | Discharge status: Home / Self Care | Payer: Medicaid Other | Attending: Emergency Medicine | Admitting: Emergency Medicine

## 2013-10-26 ENCOUNTER — Encounter (HOSPITAL_BASED_OUTPATIENT_CLINIC_OR_DEPARTMENT_OTHER): Payer: Self-pay | Admitting: Emergency Medicine

## 2013-10-26 DIAGNOSIS — F411 Generalized anxiety disorder: Secondary | ICD-10-CM | POA: Insufficient documentation

## 2013-10-26 DIAGNOSIS — Z8619 Personal history of other infectious and parasitic diseases: Secondary | ICD-10-CM | POA: Diagnosis not present

## 2013-10-26 DIAGNOSIS — Z79899 Other long term (current) drug therapy: Secondary | ICD-10-CM | POA: Diagnosis not present

## 2013-10-26 DIAGNOSIS — Z8701 Personal history of pneumonia (recurrent): Secondary | ICD-10-CM | POA: Insufficient documentation

## 2013-10-26 DIAGNOSIS — K219 Gastro-esophageal reflux disease without esophagitis: Secondary | ICD-10-CM | POA: Diagnosis not present

## 2013-10-26 DIAGNOSIS — Z792 Long term (current) use of antibiotics: Secondary | ICD-10-CM | POA: Diagnosis not present

## 2013-10-26 DIAGNOSIS — Z791 Long term (current) use of non-steroidal anti-inflammatories (NSAID): Secondary | ICD-10-CM | POA: Diagnosis not present

## 2013-10-26 DIAGNOSIS — G2581 Restless legs syndrome: Secondary | ICD-10-CM | POA: Diagnosis not present

## 2013-10-26 DIAGNOSIS — F909 Attention-deficit hyperactivity disorder, unspecified type: Secondary | ICD-10-CM | POA: Diagnosis not present

## 2013-10-26 DIAGNOSIS — R451 Restlessness and agitation: Secondary | ICD-10-CM

## 2013-10-26 DIAGNOSIS — R4589 Other symptoms and signs involving emotional state: Secondary | ICD-10-CM | POA: Diagnosis not present

## 2013-10-26 DIAGNOSIS — G4700 Insomnia, unspecified: Secondary | ICD-10-CM | POA: Insufficient documentation

## 2013-10-26 DIAGNOSIS — Z87891 Personal history of nicotine dependence: Secondary | ICD-10-CM | POA: Diagnosis not present

## 2013-10-26 MED ORDER — DIPHENHYDRAMINE HCL 25 MG PO TABS: 25.0000 mg | ORAL_TABLET | Freq: Four times a day (QID) | ORAL | Status: DC | PRN

## 2013-10-26 MED ORDER — DIPHENHYDRAMINE HCL 25 MG PO CAPS
ORAL_CAPSULE | ORAL | Status: AC
  Administered 2013-10-26: 05:00:00 25 mg via ORAL
  Filled 2013-10-26: qty 1

## 2013-10-26 MED ORDER — DIPHENHYDRAMINE HCL 25 MG PO CAPS
25.0000 mg | ORAL_CAPSULE | Freq: Once | ORAL | Status: AC
  Administered 2013-10-26: 25 mg via ORAL

## 2013-10-26 NOTE — ED Notes (Signed)
Pt states has restless arm and leg syndrome, states take lorazepam with no relief, pt appear very anxious

## 2013-10-26 NOTE — ED Provider Notes (Signed)
CSN: [id number]     Arrival date & time 10/26/13  0341 History   First MD Initiated Contact with Patient 10/26/13 [phone]     Chief Complaint  Patient presents with  . restless leg and arm      (Consider location/radiation/quality/duration/timing/severity/associated sxs/prior Treatment) HPI Patient with history of ADHD and restless leg syndrome states that she took her via advanced earlier this evening. She states she then became anxious with tingling and restlessness to her arms and legs. Over 4 hours she took 4 mg of Ativan at home. Little improvement. She denies any focal weakness or numbness. She denies any other coingestants. She denied alcohol use recently. Past Medical History  Diagnosis Date  . Attention deficit hyperactivity disorder (ADHD)   . Hypoglycemia   . Central auditory processing disorder   . GERD (gastroesophageal reflux disease)   . Hepatitis A   . Pneumonia   . Restless leg syndrome    History reviewed. No pertinent past surgical history. No family history on file. History  Substance Use Topics  . Smoking status: Former Games developermoker  . Smokeless tobacco: Not on file  . Alcohol Use: No   OB History   Grav Para Term Preterm Abortions TAB SAB Ect Mult Living   1              Review of Systems  Constitutional: Negative for fever and chills.  Respiratory: Negative for shortness of breath.   Cardiovascular: Negative for chest pain.  Gastrointestinal: Negative for nausea, vomiting and abdominal pain.  Musculoskeletal: Negative for back pain, neck pain and neck stiffness.  Skin: Negative for rash and wound.  Neurological: Negative for dizziness, weakness, light-headedness, numbness and headaches.  Psychiatric/Behavioral: The patient is nervous/anxious.   All other systems reviewed and are negative.     Allergies  Sulfa antibiotics  Home Medications   Prior to Admission medications   Medication Sig Start Date End Date Taking? Authorizing Provider  ALPRAZolam  Prudy Feeler(XANAX) 0.5 MG tablet Take 0.5 mg by mouth 2 (two) times daily as needed for anxiety.    Historical Provider, MD  cephALEXin (KEFLEX) 500 MG capsule Take 1 capsule (500 mg total) by mouth 4 (four) times daily. 09/06/12   Marissa Sciacca, PA-C  cloNIDine (CATAPRES) 0.2 MG tablet Take 1 tablet (0.2 mg total) by mouth 3 (three) times daily. 10/20/13   Courtney A Forcucci, PA-C  dicyclomine (BENTYL) 20 MG tablet Take 1 tablet (20 mg total) by mouth 2 (two) times daily. 10/20/13   Courtney A Forcucci, PA-C  escitalopram (LEXAPRO) 20 MG tablet Take 20 mg by mouth daily.    Historical Provider, MD  HYDROcodone-acetaminophen (NORCO/VICODIN) 5-325 MG per tablet Take 1-2 tablets by mouth every 6 (six) hours as needed for moderate pain. 07/30/13   Vanetta MuldersScott Zackowski, MD  Lisdexamfetamine Dimesylate (VYVANSE PO) Take by mouth.    Historical Provider, MD  LORazepam (ATIVAN) 1 MG tablet Take 1 tablet (1 mg total) by mouth 3 (three) times daily as needed (for anxiety or restless legs). 04/28/13   John L Molpus, MD  metroNIDAZOLE (FLAGYL) 500 MG tablet Take 1 tablet (500 mg total) by mouth 2 (two) times daily. 12/17/12   Elenora GammaSamuel L Bradshaw, MD  naproxen (NAPROSYN) 500 MG tablet Take 1 tablet (500 mg total) by mouth 2 (two) times daily. 09/06/12   Marissa Sciacca, PA-C  naproxen (NAPROSYN) 500 MG tablet Take 1 tablet (500 mg total) by mouth 2 (two) times daily. 07/30/13   Vanetta MuldersScott Zackowski, MD  nitrofurantoin,  macrocrystal-monohydrate, (MACROBID) 100 MG capsule Take 1 capsule (100 mg total) by mouth 2 (two) times daily. X 7 days 04/28/13   Carlisle Beers Molpus, MD  ondansetron (ZOFRAN) 4 MG tablet Take 1 tablet (4 mg total) by mouth every 6 (six) hours. 10/20/13   Courtney A Forcucci, PA-C  penicillin v potassium (VEETID) 500 MG tablet Take 1 tablet (500 mg total) by mouth 4 (four) times daily. 07/30/13   Vanetta Mulders, MD   BP 126/78  Pulse 102  Temp(Src) 98.6 F (37 C) (Oral)  Resp 18  Ht 5' 7.5" (1.715 m)  Wt 145 lb (65.772 kg)  BMI  22.36 kg/m2  SpO2 100% Physical Exam  Nursing note and vitals reviewed. Constitutional: She is oriented to person, place, and time. She appears well-developed and well-nourished.  Anxious appearing  HENT:  Head: Normocephalic and atraumatic.  Mouth/Throat: Oropharynx is clear and moist.  Eyes: EOM are normal. Pupils are equal, round, and reactive to light.  Pupils 4 mm bilaterally and reactive.  Neck: Normal range of motion. Neck supple.  No meningismus  Cardiovascular: Normal rate and regular rhythm.   Pulmonary/Chest: Effort normal and breath sounds normal. No respiratory distress. She has no wheezes. She has no rales.  Abdominal: Soft. Bowel sounds are normal.  Musculoskeletal: Normal range of motion. She exhibits no edema and no tenderness.  Neurological: She is alert and oriented to person, place, and time.  Patient is alert and oriented x3 with clear, goal oriented speech. Patient has 5/5 motor in all extremities. Sensation is intact to light touch. Bilateral finger-to-nose is normal with no signs of dysmetria. Patient has a normal gait and walks without assistance. Mild fine tremor   Skin: Skin is warm and dry. No rash noted. No erythema.  Psychiatric:  Anxious appearing    ED Course  Procedures (including critical care time) Labs Review Labs Reviewed - No data to display  Imaging Review No results found.   EKG Interpretation None      MDM   Final diagnoses:  None    Pt resting comfortably with Benadryl. Return precautions given  Loren Racer, MD 10/26/13 0700

## 2013-10-26 NOTE — ED Notes (Addendum)
Pt reports that between the hours of 7 pm and 10 pm  yesterday she took ativan 1 mg tab 4 times  W/o relief.

## 2013-10-26 NOTE — Discharge Instructions (Signed)
Insomnia Insomnia is frequent trouble falling and/or staying asleep. Insomnia can be a long term problem or a short term problem. Both are common. Insomnia can be a short term problem when the wakefulness is related to a certain stress or worry. Long term insomnia is often related to ongoing stress during waking hours and/or poor sleeping habits. Overtime, sleep deprivation itself can make the problem worse. Every little thing feels more severe because you are overtired and your ability to cope is decreased. CAUSES   Stress, anxiety, and depression.  Poor sleeping habits.  Distractions such as TV in the bedroom.  Naps close to bedtime.  Engaging in emotionally charged conversations before bed.  Technical reading before sleep.  Alcohol and other sedatives. They may make the problem worse. They can hurt normal sleep patterns and normal dream activity.  Stimulants such as caffeine for several hours prior to bedtime.  Pain syndromes and shortness of breath can cause insomnia.  Exercise late at night.  Changing time zones may cause sleeping problems (jet lag). It is sometimes helpful to have someone observe your sleeping patterns. They should look for periods of not breathing during the night (sleep apnea). They should also look to see how long those periods last. If you live alone or observers are uncertain, you can also be observed at a sleep clinic where your sleep patterns will be professionally monitored. Sleep apnea requires a checkup and treatment. Give your caregivers your medical history. Give your caregivers observations your family has made about your sleep.  SYMPTOMS   Not feeling rested in the morning.  Anxiety and restlessness at bedtime.  Difficulty falling and staying asleep. TREATMENT   Your caregiver may prescribe treatment for an underlying medical disorders. Your caregiver can give advice or help if you are using alcohol or other drugs for self-medication. Treatment  of underlying problems will usually eliminate insomnia problems.  Medications can be prescribed for short time use. They are generally not recommended for lengthy use.  Over-the-counter sleep medicines are not recommended for lengthy use. They can be habit forming.  You can promote easier sleeping by making lifestyle changes such as:  Using relaxation techniques that help with breathing and reduce muscle tension.  Exercising earlier in the day.  Changing your diet and the time of your last meal. No night time snacks.  Establish a regular time to go to bed.  Counseling can help with stressful problems and worry.  Soothing music and white noise may be helpful if there are background noises you cannot remove.  Stop tedious detailed work at least one hour before bedtime. HOME CARE INSTRUCTIONS   Keep a diary. Inform your caregiver about your progress. This includes any medication side effects. See your caregiver regularly. Take note of:  Times when you are asleep.  Times when you are awake during the night.  The quality of your sleep.  How you feel the next day. This information will help your caregiver care for you.  Get out of bed if you are still awake after 15 minutes. Read or do some quiet activity. Keep the lights down. Wait until you feel sleepy and go back to bed.  Keep regular sleeping and waking hours. Avoid naps.  Exercise regularly.  Avoid distractions at bedtime. Distractions include watching television or engaging in any intense or detailed activity like attempting to balance the household checkbook.  Develop a bedtime ritual. Keep a familiar routine of bathing, brushing your teeth, climbing into bed at the same   time each night, listening to soothing music. Routines increase the success of falling to sleep faster.  Use relaxation techniques. This can be using breathing and muscle tension release routines. It can also include visualizing peaceful scenes. You can  also help control troubling or intruding thoughts by keeping your mind occupied with boring or repetitive thoughts like the old concept of counting sheep. You can make it more creative like imagining planting one beautiful flower after another in your backyard garden.  During your day, work to eliminate stress. When this is not possible use some of the previous suggestions to help reduce the anxiety that accompanies stressful situations. MAKE SURE YOU:   Understand these instructions.  Will watch your condition.  Will get help right away if you are not doing well or get worse. Document Released: 01/12/2000 Document Revised: 04/08/2011 Document Reviewed: 02/11/2007 Exit[employer] Patient Information 2015 [employer]. This information is not intended to replace advice given to you by your health care provider. Make sure you discuss any questions you have with your health care provider.  

## 2013-11-29 ENCOUNTER — Encounter (HOSPITAL_BASED_OUTPATIENT_CLINIC_OR_DEPARTMENT_OTHER): Payer: Self-pay | Admitting: Emergency Medicine

## 2014-05-02 IMAGING — CT CT ABD-PELV W/O CM
2 of 4 series · 16 of 46 positions shown, 18 images · non-contrast
Comparison: Ultrasound of 05/01/2012.  No prior CT for comparison.

CLINICAL DATA: Dysuria, fever

CT ABDOMEN AND PELVIS WITHOUT CONTRAST
TECHNIQUE: Multidetector CT imaging of the abdomen and pelvis was
performed following the standard protocol without intravenous
contrast.

[Series 2: renal stone < 200 lbs 5.0 b31f · axial · 0.76mm/px · z∈[-496,-76]mm · 13 of 94 slices shown, 15 images]
[im 5/94  soft-tissue]
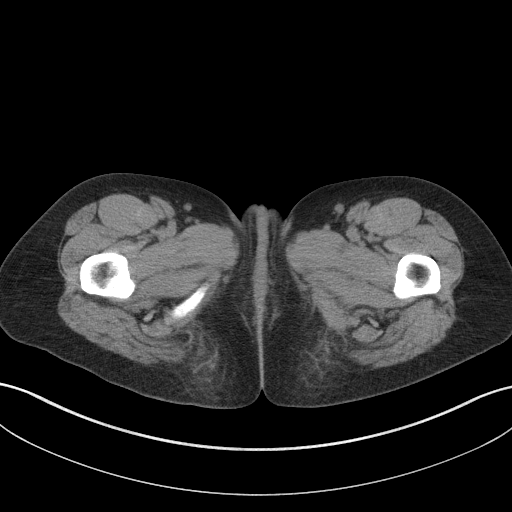
[im 5/94  bone]
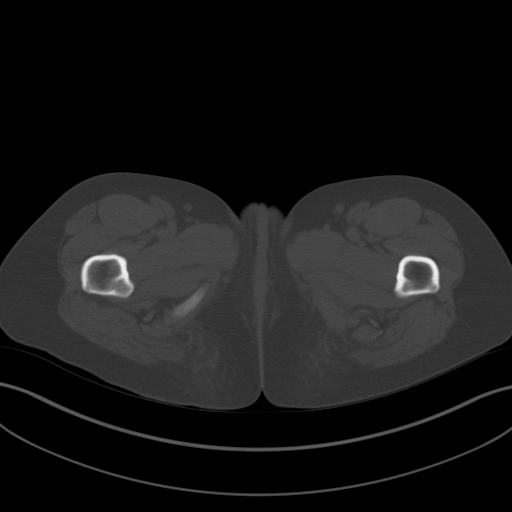
[im 13/94  soft-tissue]
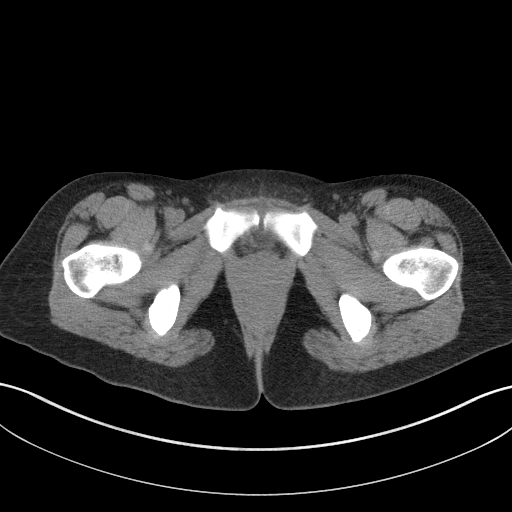
[im 21/94  soft-tissue]
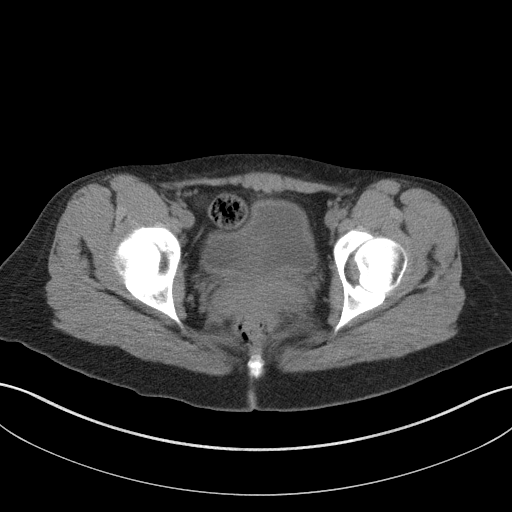
[im 25/94  soft-tissue]
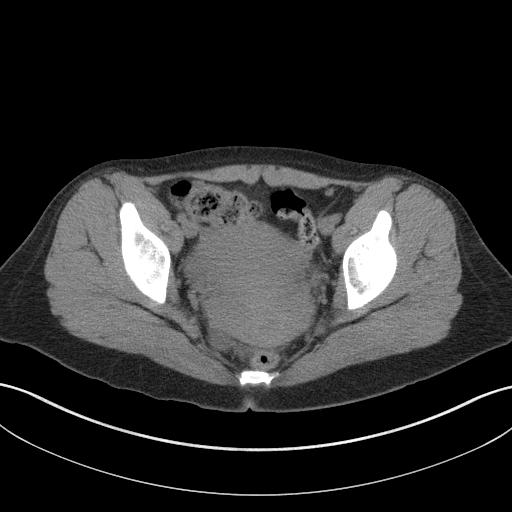
[im 33/94  soft-tissue]
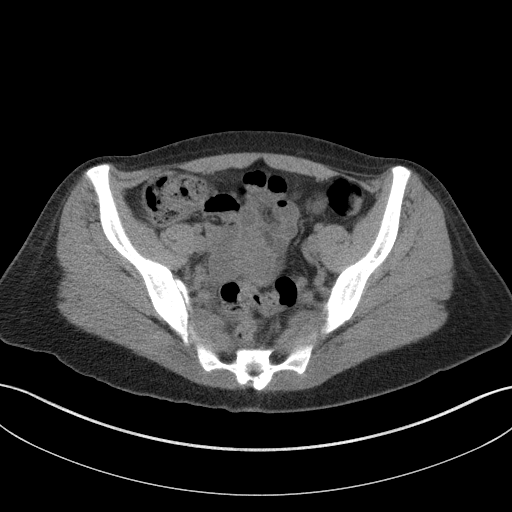
[im 41/94  soft-tissue]
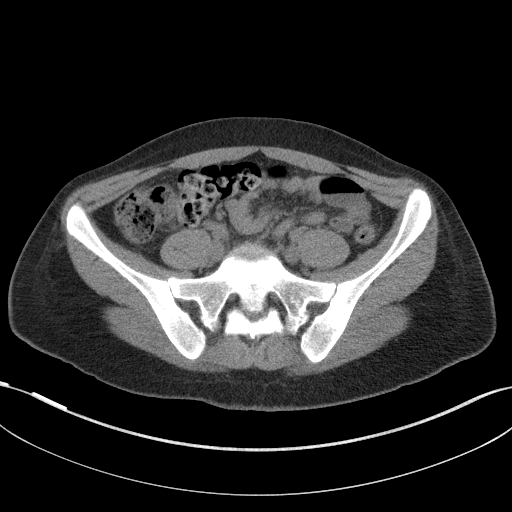
[im 49/94  soft-tissue]
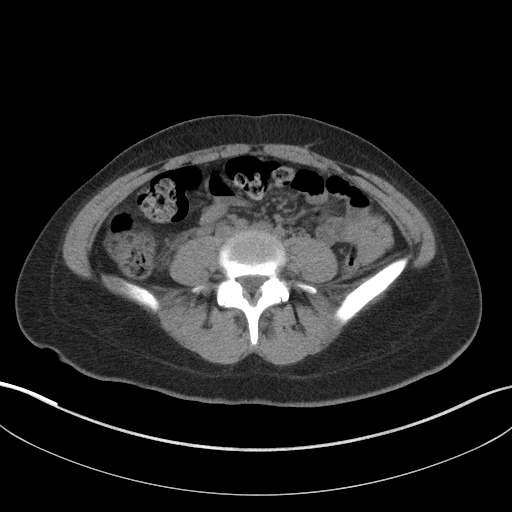
[im 53/94  soft-tissue]
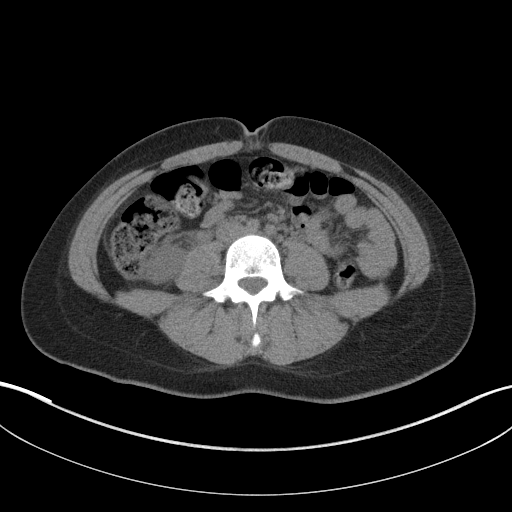
[im 61/94  soft-tissue]
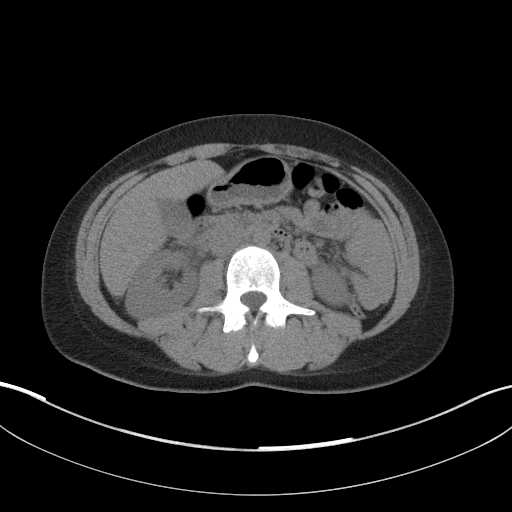
[im 61/94  bone]
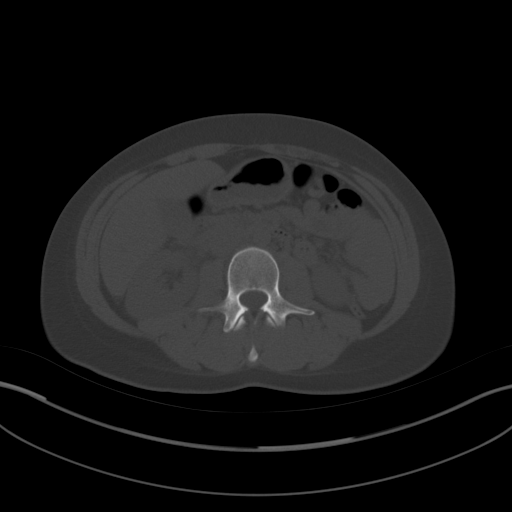
[im 69/94  soft-tissue]
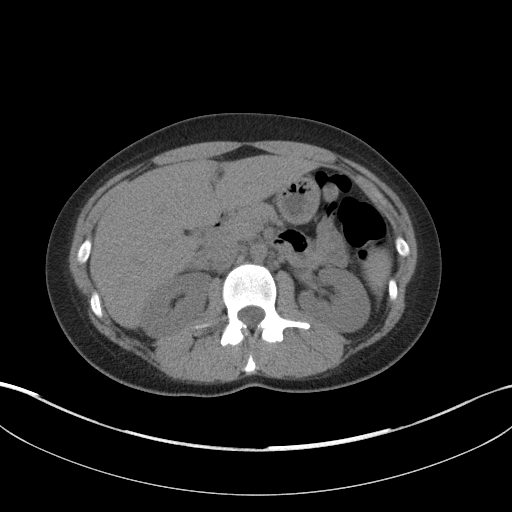
[im 73/94  soft-tissue]
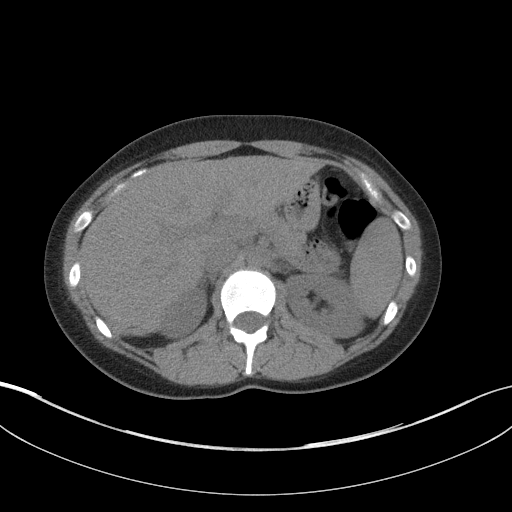
[im 81/94  soft-tissue]
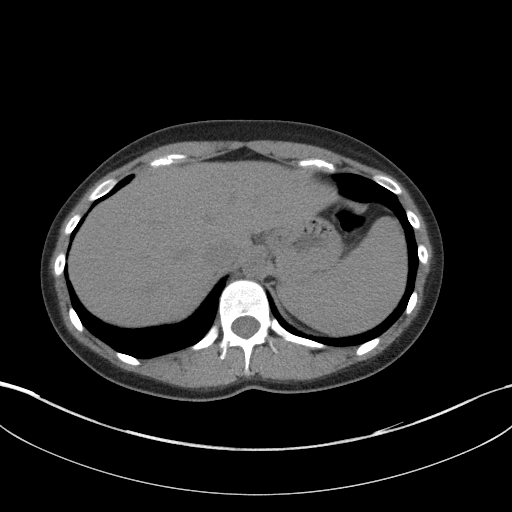
[im 89/94  soft-tissue]
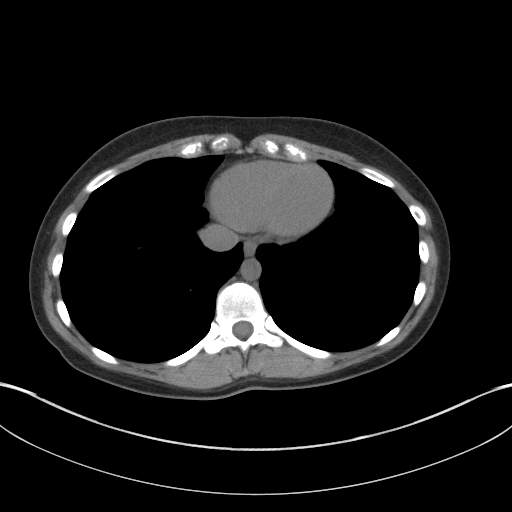

[Series 5: renal stone 3.0 coronal · coronal · 0.75mm/px · 3 of 72 slices shown]
[im 24/72  soft-tissue]
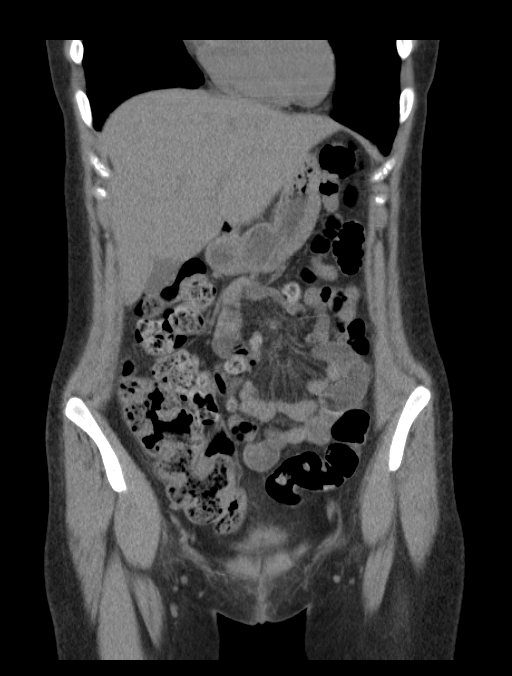
[im 32/72  soft-tissue]
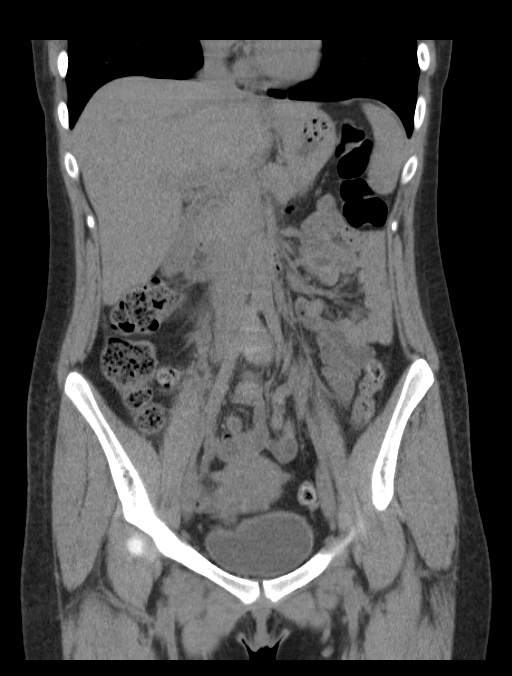
[im 40/72  soft-tissue]
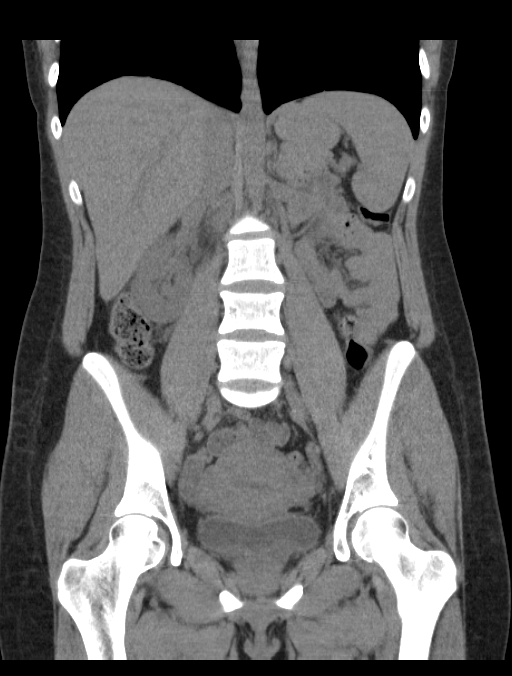

[16 of 46 positions shown; findings below may reference images not displayed]

FINDINGS: The lung bases are clear.

There is mild right perinephric stranding and proximal right
periureteral fluid.  No radiopaque renal, ureteral, or bladder
calculus is identified.  No hydroureteronephrosis.  Unenhanced
abdominal viscera otherwise unremarkable.

Uterus and ovaries are normal.  Small pelvic free fluid identified.
The appendix is not identified but there is no secondary evidence
for acute appendicitis.  No bowel wall thickening or focal
segmental dilatation.  The bladder is normal.  No acute osseous
abnormality.
IMPRESSION: Trace right perinephric and peri ureteral stranding / fluid but no
hydroureteronephrosis or radiopaque renal or ureteral calculus.
This could represent sequela of recently passed stone, although
pyelonephritis could have a similar appearance.

## 2014-08-12 IMAGING — US US OB COMP LESS 14 WK
1 series · 14 of 28 positions shown · non-contrast
Comparison: Renal ultrasound 12/17/2012.  CT 09/06/2012.

CLINICAL DATA: Pain, right flank pain.

EXAM:
OBSTETRIC <14 WK US AND TRANSVAGINAL OB US
TECHNIQUE: Both transabdominal and transvaginal ultrasound examinations were
performed for complete evaluation of the gestation as well as the
maternal uterus, adnexal regions, and pelvic cul-de-sac.
Transvaginal technique was performed to assess early pregnancy.

[Series 1: us ob comp less 14 wk · 0.28mm/px · 14 of 48 slices shown]
[im 2/48]
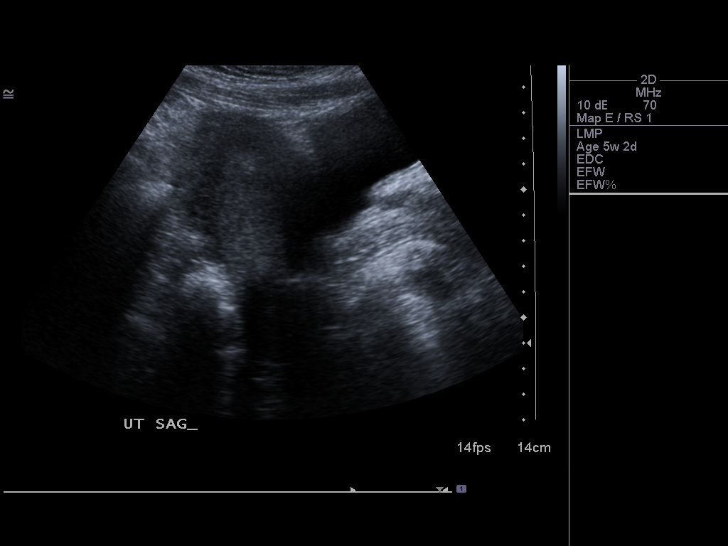
[im 6/48]
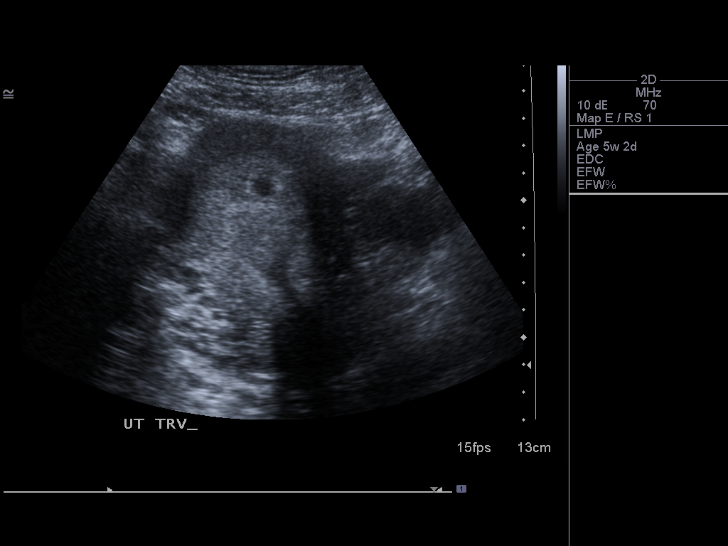
[im 9/48]
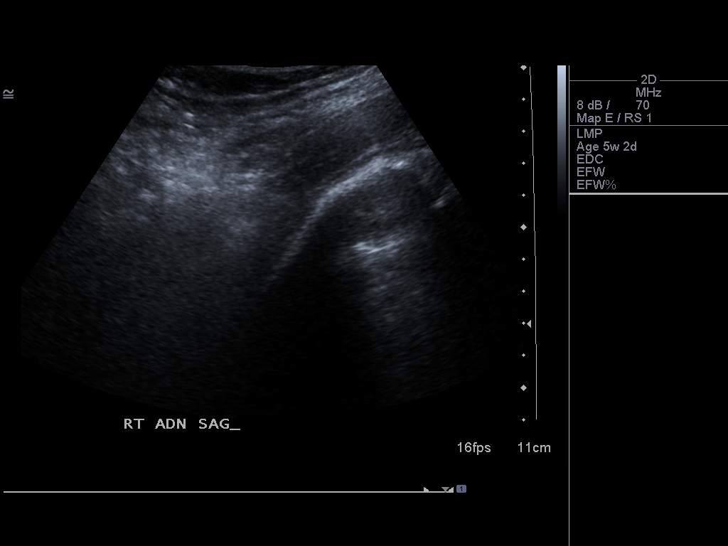
[im 13/48]
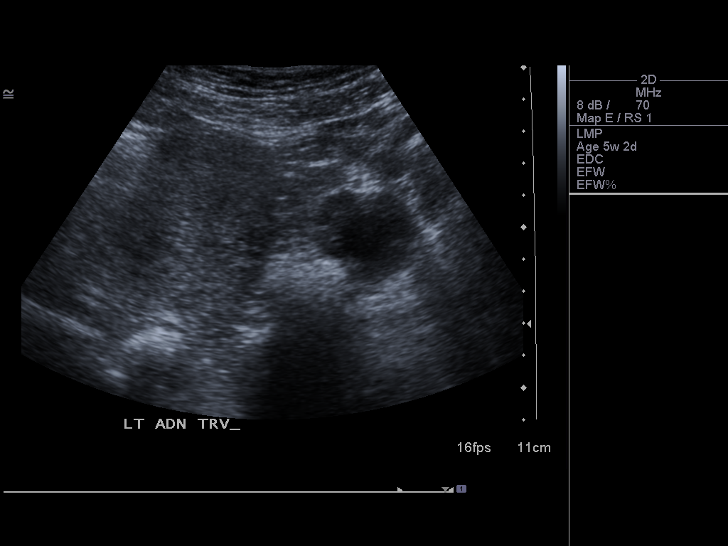
[im 16/48]
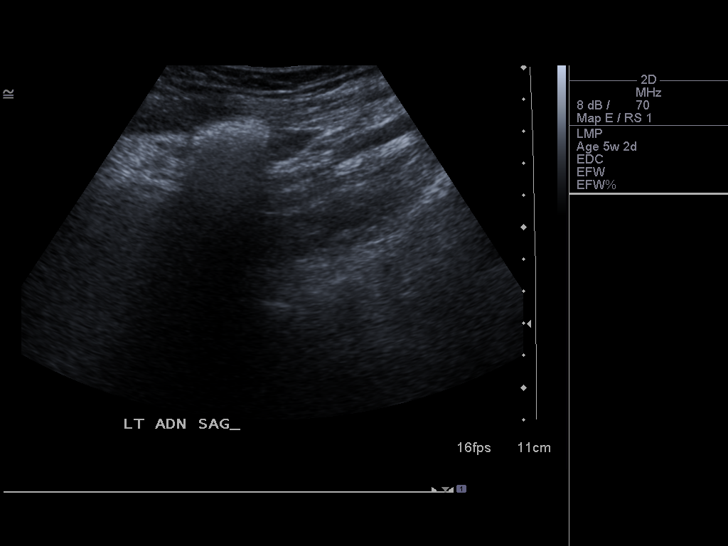
[im 20/48]
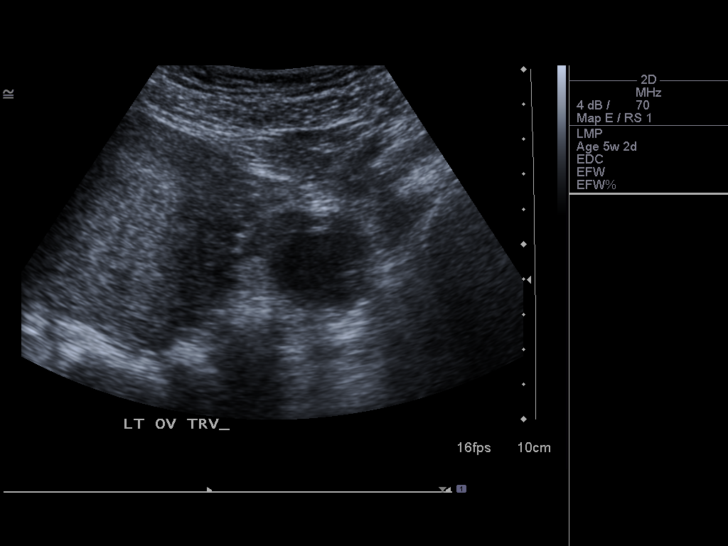
[im 23/48]
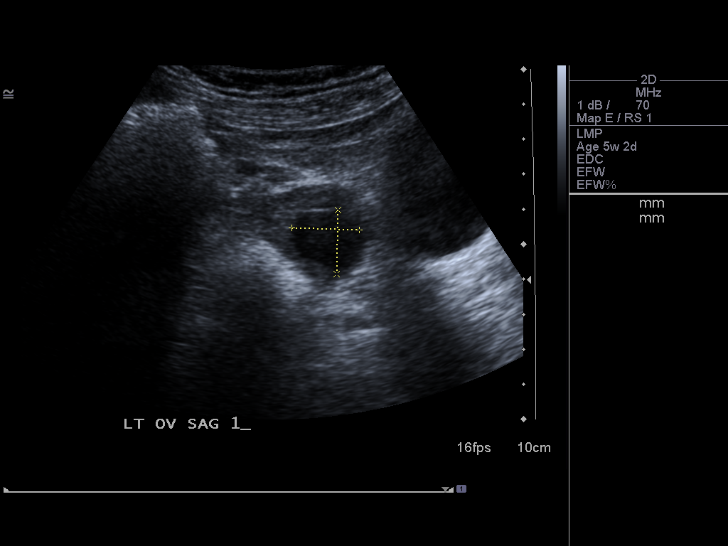
[im 27/48]
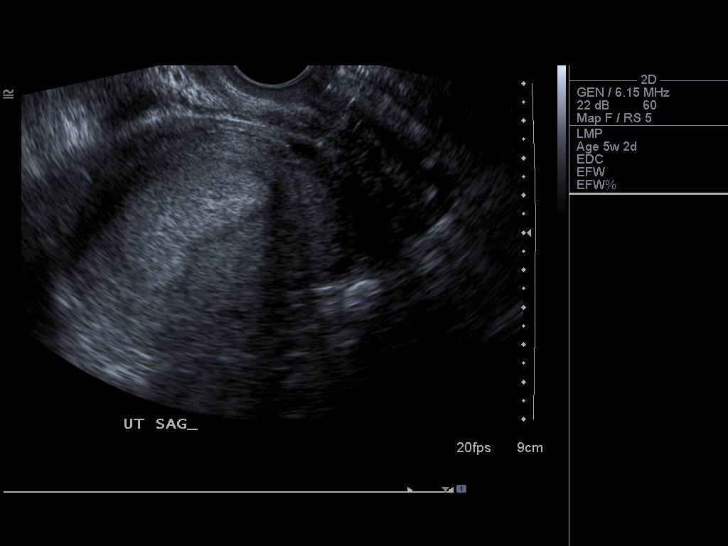
[im 30/48]
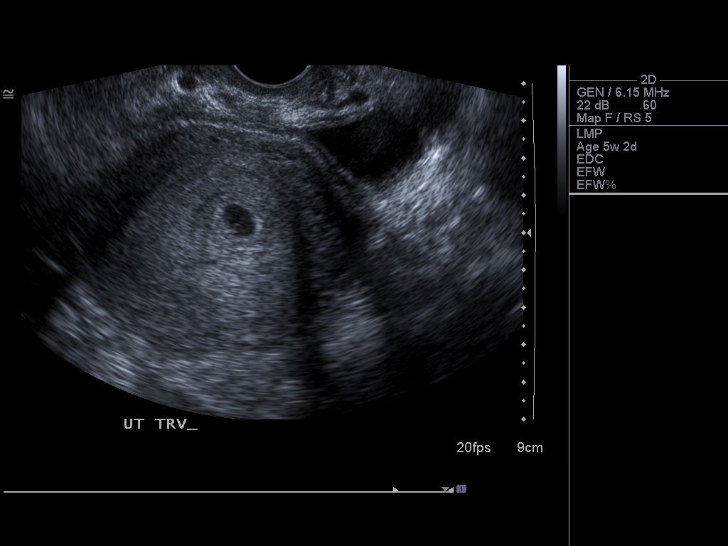
[im 34/48]
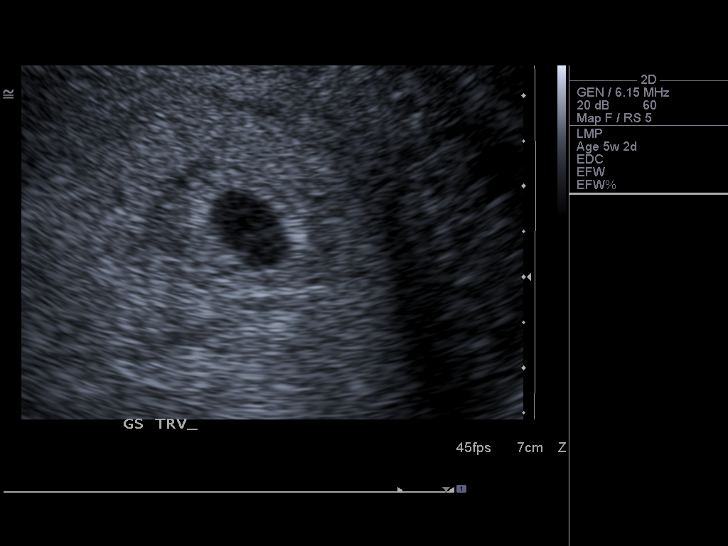
[im 37/48]
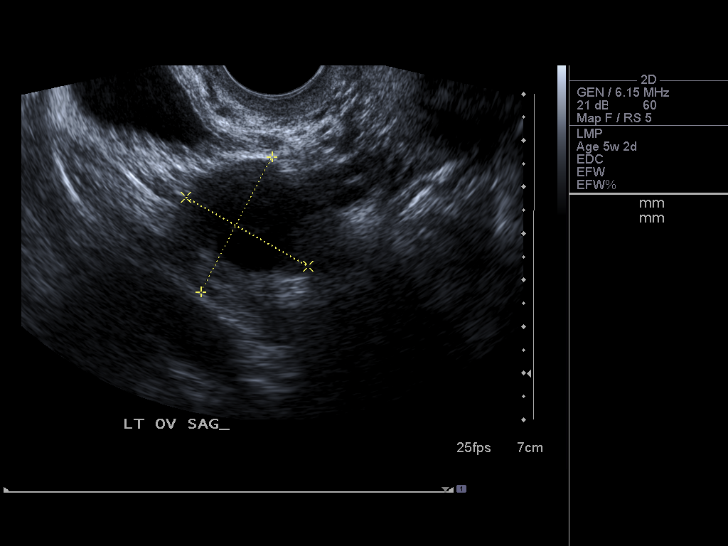
[im 41/48]
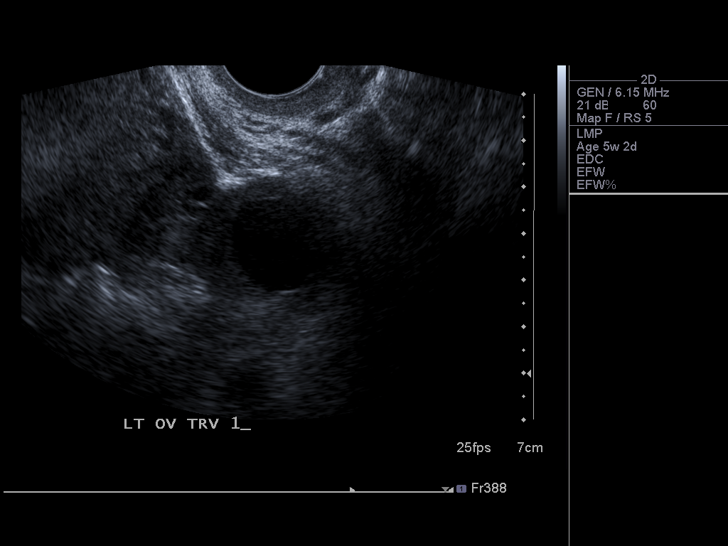
[im 44/48]
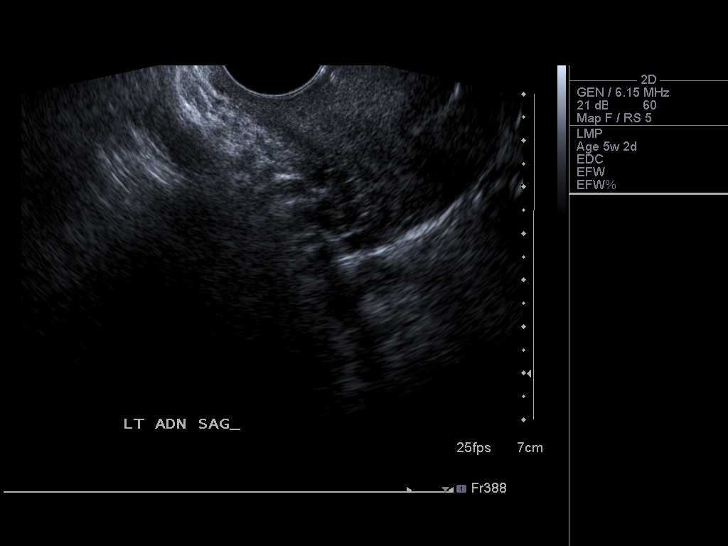
[im 48/48]
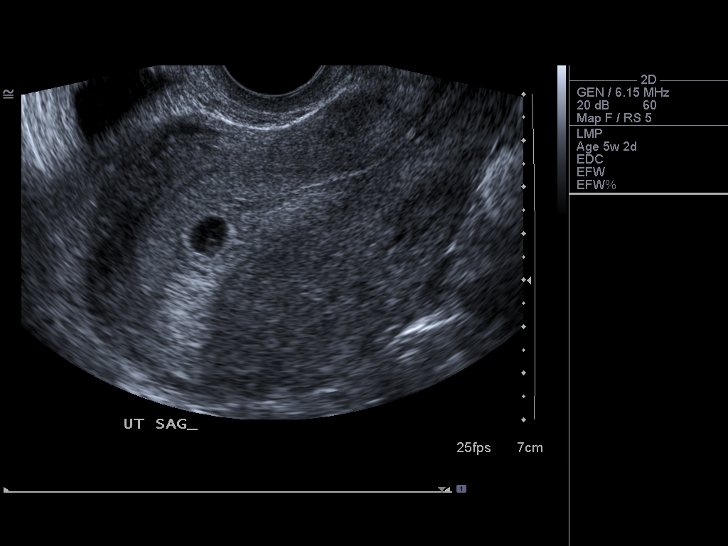

[14 of 28 positions shown; findings below may reference images not displayed]

FINDINGS: Intrauterine gestational sac: Visualized/normal in shape.

Yolk sac:  Questionable

Embryo:  Not visualized

Cardiac Activity: Not visualized

Heart Rate:   bpm

MSD:  8.3  mm   5 w   4  d

CRL:     Mm    w  d                  US EDC: 08/15/2013

Maternal uterus/adnexae: No adnexal masses. There is a small
hypoechoic area in the left ovary, likely corpus luteum. Right ovary
is not visualized. No free fluid. No subchorionic hemorrhage.
IMPRESSION: Early intrauterine pregnancy. No fetal pole currently. Probable
early yolk sac noted. Recommend followup ultrasound in 10-14 days.

No acute maternal findings.

## 2014-08-12 IMAGING — US US ABDOMEN COMPLETE
1 series · 14 of 25 positions shown · non-contrast
Comparison: CT 09/06/2012.  Ultrasound 05/01/2012.

CLINICAL DATA: Pain.

EXAM:
ULTRASOUND ABDOMEN COMPLETE

[Series 1: us abdomen complete · 0.28mm/px · 14 of 69 slices shown]
[im 1/69]
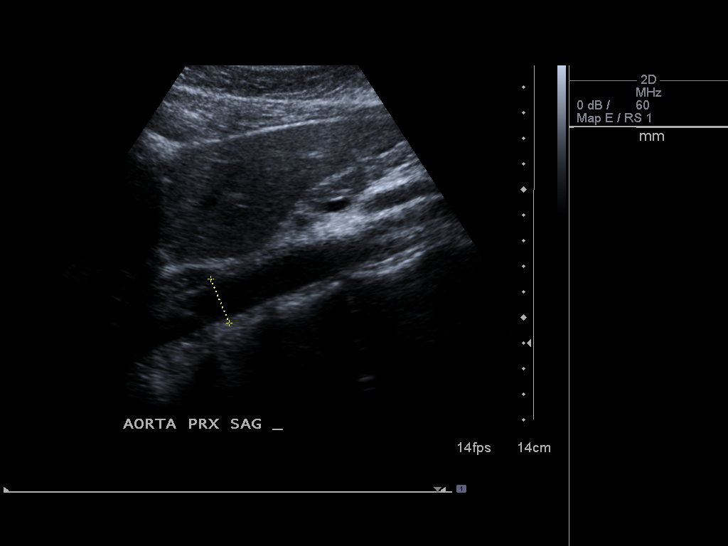
[im 6/69]
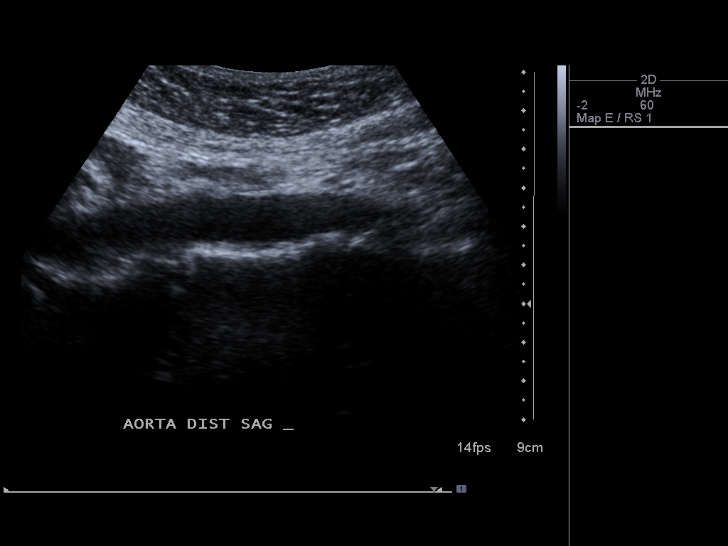
[im 12/69]
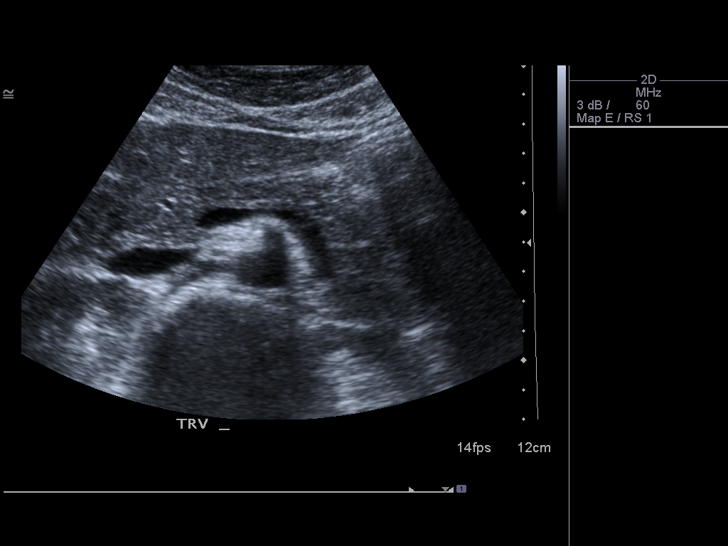
[im 18/69]
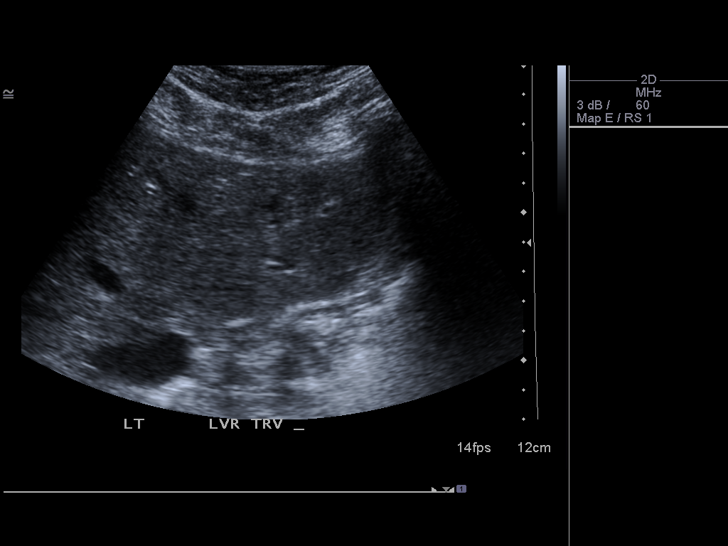
[im 23/69]
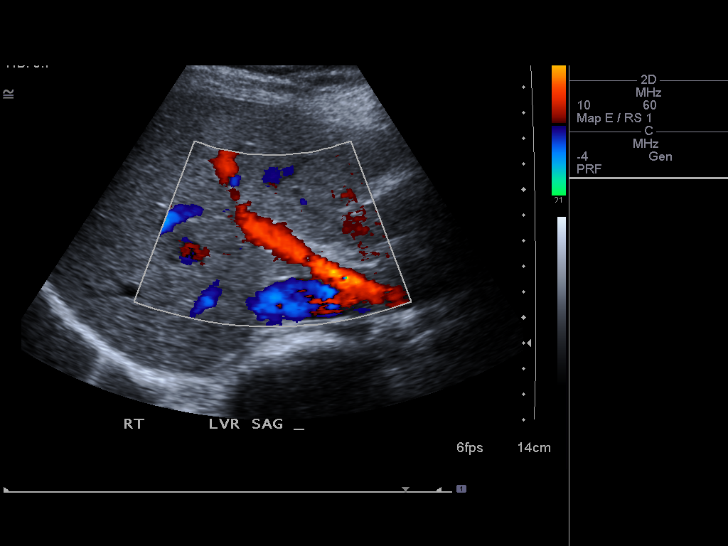
[im 26/69]
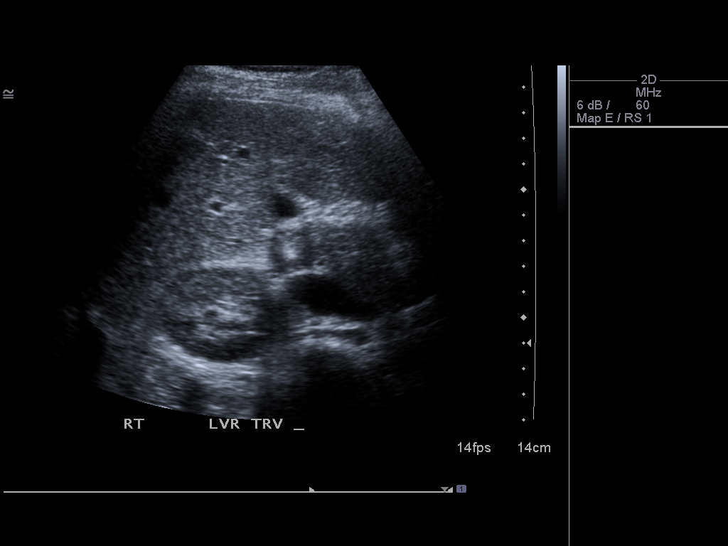
[im 32/69]
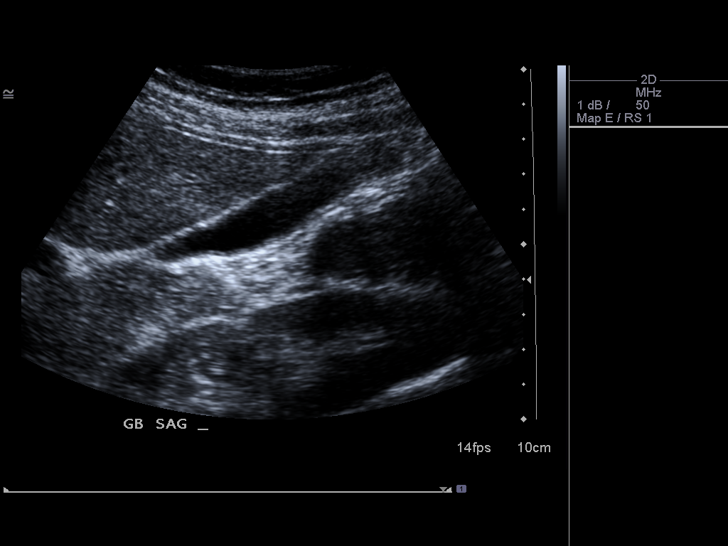
[im 37/69]
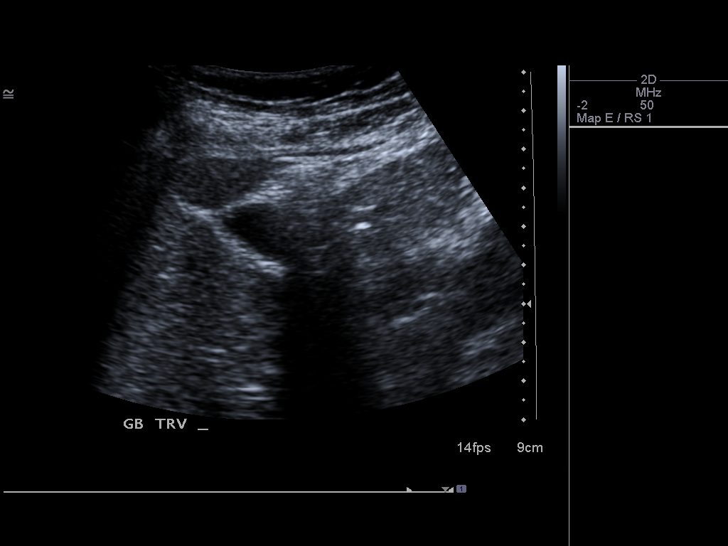
[im 43/69]
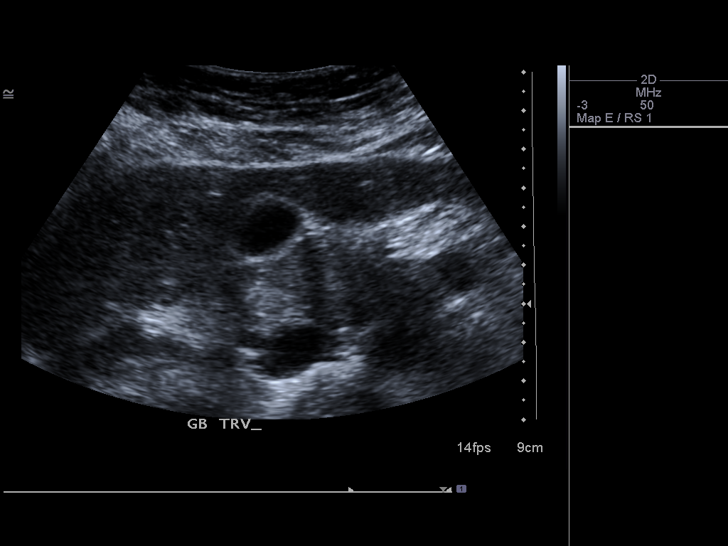
[im 46/69]
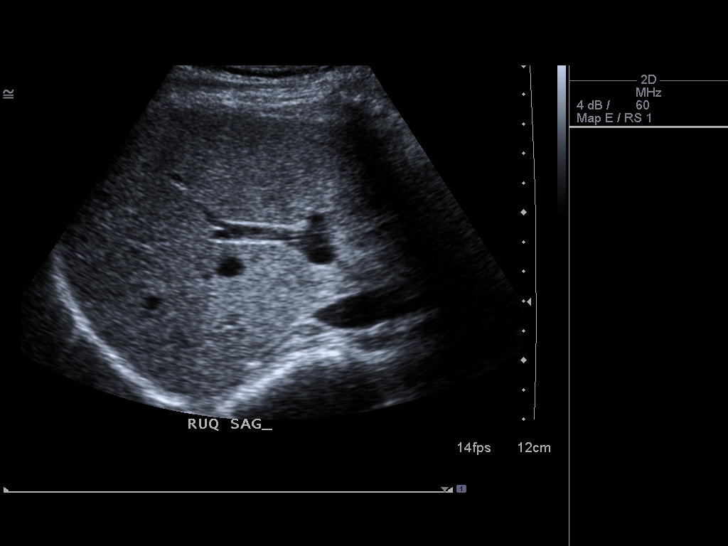
[im 52/69]
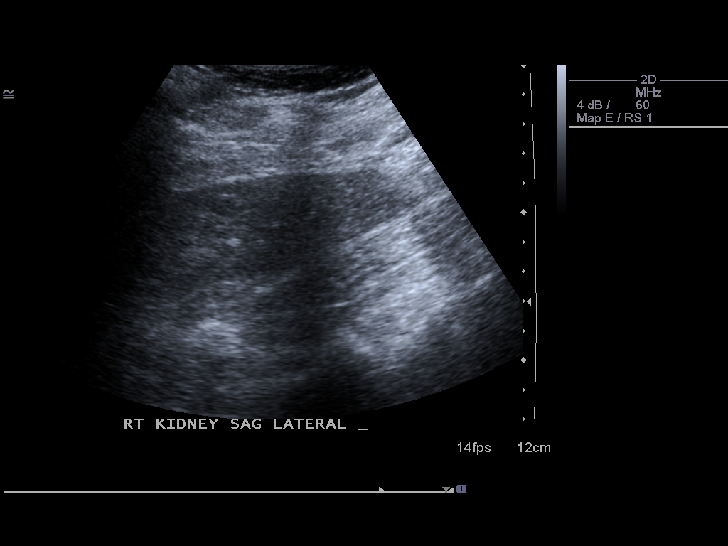
[im 57/69]
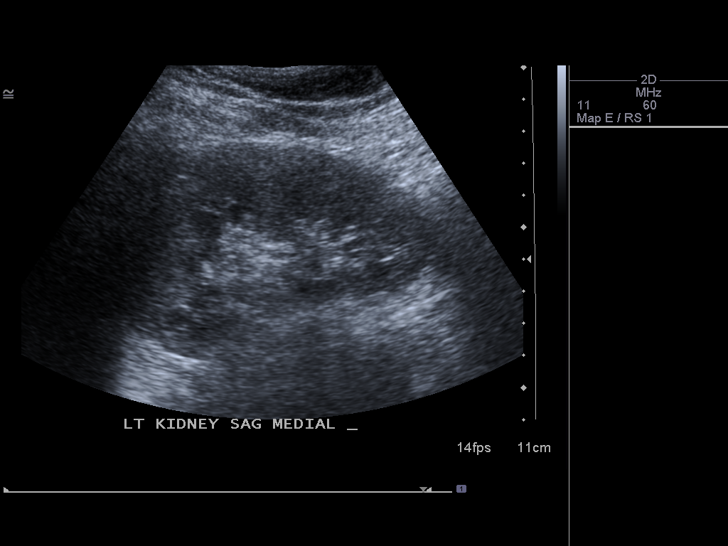
[im 63/69]
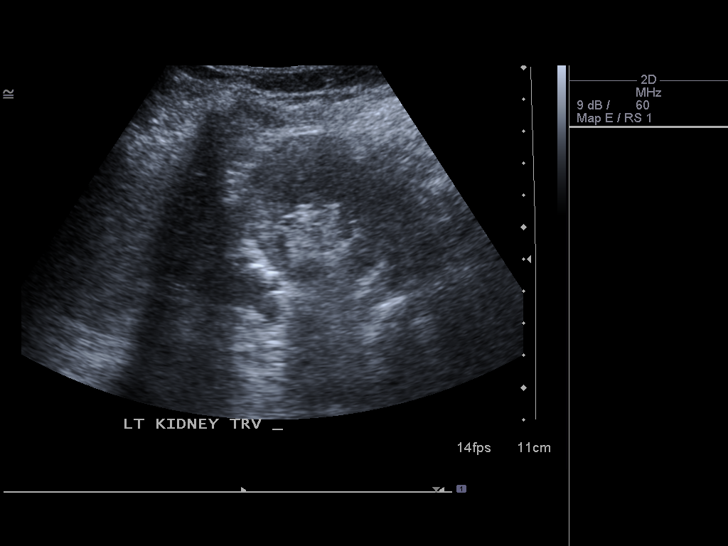
[im 69/69]
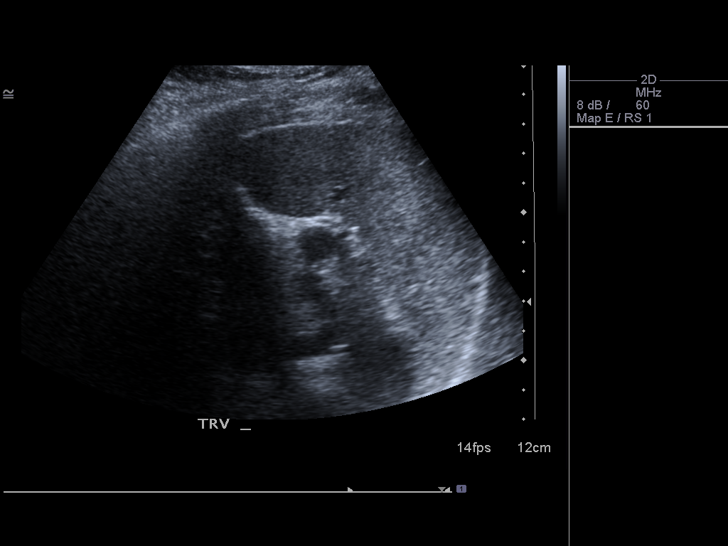

[14 of 25 positions shown; findings below may reference images not displayed]

FINDINGS: Gallbladder

No gallstones or wall thickening visualized. No sonographic Murphy
sign noted.

Common bile duct

Diameter: 2 mm.

Liver

No focal lesion identified. Within normal limits in parenchymal
echogenicity.

IVC

No abnormality visualized.

Pancreas

Visualized portion unremarkable.

Spleen

Size and appearance within normal limits.

Right Kidney

Length: 11.6 cm.. Echogenicity within normal limits. No mass or
hydronephrosis visualized.

Left Kidney

Length: 10.2 cm. Echogenicity within normal limits. No mass or
hydronephrosis visualized.

Abdominal aorta

No aneurysm visualized.
IMPRESSION: Negative exam.  No gallstones .

## 2016-01-20 ENCOUNTER — Emergency Department (HOSPITAL_BASED_OUTPATIENT_CLINIC_OR_DEPARTMENT_OTHER)
Admission: EM | Admit: 2016-01-20 | Discharge: 2016-01-20 | Disposition: A | Discharge status: Home / Self Care | Payer: Medicaid Other | Attending: Emergency Medicine | Admitting: Emergency Medicine

## 2016-01-20 ENCOUNTER — Encounter (HOSPITAL_BASED_OUTPATIENT_CLINIC_OR_DEPARTMENT_OTHER): Payer: Self-pay | Admitting: Emergency Medicine

## 2016-01-20 DIAGNOSIS — J029 Acute pharyngitis, unspecified: Secondary | ICD-10-CM | POA: Diagnosis present

## 2016-01-20 DIAGNOSIS — Z79899 Other long term (current) drug therapy: Secondary | ICD-10-CM | POA: Insufficient documentation

## 2016-01-20 DIAGNOSIS — J069 Acute upper respiratory infection, unspecified: Secondary | ICD-10-CM | POA: Diagnosis not present

## 2016-01-20 DIAGNOSIS — F17200 Nicotine dependence, unspecified, uncomplicated: Secondary | ICD-10-CM | POA: Diagnosis not present

## 2016-01-20 DIAGNOSIS — F909 Attention-deficit hyperactivity disorder, unspecified type: Secondary | ICD-10-CM | POA: Insufficient documentation

## 2016-01-20 DIAGNOSIS — B9789 Other viral agents as the cause of diseases classified elsewhere: Secondary | ICD-10-CM

## 2016-01-20 HISTORY — DX: Fibromyalgia: M79.7

## 2016-01-20 LAB — RAPID STREP SCREEN (MED CTR MEBANE ONLY): Streptococcus, Group A Screen (Direct): NEGATIVE

## 2016-01-20 MED ORDER — LORATADINE 10 MG PO TABS: 10.0000 mg | ORAL_TABLET | Freq: Every day | ORAL | 0 refills | Status: DC

## 2016-01-20 NOTE — ED Provider Notes (Signed)
[facility] Note   CSN: [id number] Arrival date & time: 01/20/16  1937  By signing my name below, I, Modena JanskyAlbert Thayil, attest that [id number] documentation has been prepared under the direction and in the presence of Lyndal Pulleyaniel Jaycee Pelzer, MD . Electronically Signed: Modena JanskyAlbert Thayil, Scribe. 01/20/2016. 9:38 PM . History   Chief Complaint Chief Complaint  Patient presents with  . Sore Throat   The history is provided by the patient. No language interpreter was used.   HPI Comments: Ariel Spears is a 20 y.o. female who presents to the Emergency Department complaining of constant moderate sore throat that started 2 days ago. She states she has been feeling sick lately. She reports associated cough and chest pain. She has been taking advil with some relief. She denies any other complaints.     PCP: Terressa KoyanagiLONG,ASHLEY B, PA-C  Past Medical History:  Diagnosis Date  . Attention deficit hyperactivity disorder (ADHD)   . Central auditory processing disorder   . Fibromyalgia   . GERD (gastroesophageal reflux disease)   . Hepatitis A   . Hypoglycemia   . Pneumonia   . Restless leg syndrome     There are no active problems to display for this patient.   History reviewed. No pertinent surgical history.  OB History    Gravida Para Term Preterm AB Living   1             SAB TAB Ectopic Multiple Live Births                   Home Medications    Prior to Admission medications   Medication Sig Start Date End Date Taking? Authorizing Provider  buprenorphine-naloxone (SUBOXONE) 8-2 MG SUBL SL tablet Place 1 tablet under the tongue daily.   Yes Historical Provider, MD  buPROPion (WELLBUTRIN XL) 150 MG 24 hr tablet Take 150 mg by mouth daily.   Yes Historical Provider, MD  citalopram (CELEXA) 20 MG tablet Take 20 mg by mouth daily.   Yes Historical Provider, MD  ALPRAZolam Prudy Feeler(XANAX) 0.5 MG tablet Take 0.5 mg by mouth 2 (two) times daily as needed for anxiety.    Historical  Provider, MD  cephALEXin (KEFLEX) 500 MG capsule Take 1 capsule (500 mg total) by mouth 4 (four) times daily. 09/06/12   Marissa Sciacca, PA-C  cloNIDine (CATAPRES) 0.2 MG tablet Take 1 tablet (0.2 mg total) by mouth 3 (three) times daily. 10/20/13   Courtney Forcucci, PA-C  dicyclomine (BENTYL) 20 MG tablet Take 1 tablet (20 mg total) by mouth 2 (two) times daily. 10/20/13   Courtney Forcucci, PA-C  diphenhydrAMINE (BENADRYL) 25 MG tablet Take 1 tablet (25 mg total) by mouth every 6 (six) hours as needed for sleep. 10/26/13   Loren Raceravid Yelverton, MD  escitalopram (LEXAPRO) 20 MG tablet Take 20 mg by mouth daily.    Historical Provider, MD  HYDROcodone-acetaminophen (NORCO/VICODIN) 5-325 MG per tablet Take 1-2 tablets by mouth every 6 (six) hours as needed for moderate pain. 07/30/13   Vanetta MuldersScott Zackowski, MD  Lisdexamfetamine Dimesylate (VYVANSE PO) Take by mouth.    Historical Provider, MD  loratadine (CLARITIN) 10 MG tablet Take 1 tablet (10 mg total) by mouth daily. 01/20/16   Lyndal Pulleyaniel Roena Sassaman, MD  LORazepam (ATIVAN) 1 MG tablet Take 1 tablet (1 mg total) by mouth 3 (three) times daily as needed (for anxiety or restless legs). 04/28/13   John Molpus, MD  metroNIDAZOLE (FLAGYL) 500 MG tablet Take 1 tablet (500 mg total)  by mouth 2 (two) times daily. 12/17/12   Elenora Gamma, MD  naproxen (NAPROSYN) 500 MG tablet Take 1 tablet (500 mg total) by mouth 2 (two) times daily. 09/06/12   Marissa Sciacca, PA-C  naproxen (NAPROSYN) 500 MG tablet Take 1 tablet (500 mg total) by mouth 2 (two) times daily. 07/30/13   Vanetta Mulders, MD  nitrofurantoin, macrocrystal-monohydrate, (MACROBID) 100 MG capsule Take 1 capsule (100 mg total) by mouth 2 (two) times daily. X 7 days 04/28/13   Paula Libra, MD  ondansetron (ZOFRAN) 4 MG tablet Take 1 tablet (4 mg total) by mouth every 6 (six) hours. 10/20/13   Courtney Forcucci, PA-C  penicillin v potassium (VEETID) 500 MG tablet Take 1 tablet (500 mg total) by mouth 4 (four) times daily.  07/30/13   Vanetta Mulders, MD    Family History History reviewed. No pertinent family history.  Social History Social History  Substance Use Topics  . Smoking status: Current Every Day Smoker  . Smokeless tobacco: Never Used  . Alcohol use No     Allergies   Sulfa antibiotics   Review of Systems Review of Systems  HENT: Positive for sore throat.   Respiratory: Positive for cough.   Cardiovascular: Positive for chest pain.  All other systems reviewed and are negative.  Physical Exam Updated Vital Signs BP 120/76 (BP Location: Right Arm)   Pulse 89   Temp 98.3 F (36.8 C) (Oral)   Resp 18   Ht 5\' 7"  (1.702 m)   Wt 190 lb (86.2 kg)   LMP 01/19/2016   SpO2 100%   BMI 29.76 kg/m   Physical Exam  Constitutional: She is oriented to person, place, and time. She appears well-developed and well-nourished. No distress.  HENT:  Head: Normocephalic.  Nose: Nose normal.  Mouth/Throat: Mucous membranes are not dry.  No tonsillar hypertrophy.  No oropharyngeal erythema. No lymphadenopathy.   Eyes: Conjunctivae are normal.  Neck: Neck supple. No tracheal deviation present.  Cardiovascular: Normal rate and regular rhythm.   No murmur heard. Pulmonary/Chest: Effort normal. No respiratory distress. She has no wheezes. She has no rales.  Abdominal: Soft. She exhibits no distension.  Neurological: She is alert and oriented to person, place, and time.  Skin: Skin is warm and dry.  Psychiatric: She has a normal mood and affect.  Nursing note and vitals reviewed.    ED Treatments / Results  DIAGNOSTIC STUDIES: Oxygen Saturation is 100% on RA, normal by my interpretation.    COORDINATION OF CARE: 9:42 PM- Pt advised of plan for treatment and pt agrees.  Labs (all labs ordered are listed, but only abnormal results are displayed) Labs Reviewed  RAPID STREP SCREEN (NOT AT [facility])  CULTURE, GROUP A STREP [facility])    EKG  EKG Interpretation None       Radiology No  results found.  Procedures Procedures (including critical care time)  Medications Ordered in ED Medications - No data to display   Initial Impression / Assessment and Plan / ED Course  I have reviewed the triage vital signs and the nursing notes.  Pertinent labs & imaging results that were available during my care of the patient were reviewed by me and considered in my medical decision making (see chart for details).  Clinical Course     20 y.o. female presents with sore throat and cough. Strep screen negative and no clinical signs. No signs of respiratory distress, non-toxic appearing, CTAB, no concern for pneumonia with this clinical picture.  No emergent testing indicated at this time. Pt discharged with likely viral cough which will be self limited in its course. Suspect sore throat is d/t postnasal drip or irritation from cough. Provided Claritin to help with congestion. Plan to follow up with PCP as needed and return precautions discussed for worsening or new concerning symptoms.   Final Clinical Impressions(s) / ED Diagnoses   Final diagnoses:  Sore throat  Viral URI with cough    New Prescriptions New Prescriptions   LORATADINE (CLARITIN) 10 MG TABLET    Take 1 tablet (10 mg total) by mouth daily.   I personally performed the services described in this documentation, which was scribed in my presence. The recorded information has been reviewed and is accurate.      Lyndal Pulleyaniel Ilario Dhaliwal, MD 01/21/16 [id number]

## 2016-01-20 NOTE — ED Triage Notes (Signed)
Patient states that she has had a sore throat x 2 days.

## 2016-01-22 LAB — CULTURE, GROUP A STREP (THRC)

## 2018-03-17 ENCOUNTER — Other Ambulatory Visit (HOSPITAL_BASED_OUTPATIENT_CLINIC_OR_DEPARTMENT_OTHER): Payer: Self-pay | Admitting: Emergency Medicine

## 2018-03-17 ENCOUNTER — Encounter (HOSPITAL_BASED_OUTPATIENT_CLINIC_OR_DEPARTMENT_OTHER): Payer: Self-pay | Admitting: Emergency Medicine

## 2018-03-17 ENCOUNTER — Other Ambulatory Visit: Payer: Self-pay

## 2018-03-17 ENCOUNTER — Encounter (HOSPITAL_BASED_OUTPATIENT_CLINIC_OR_DEPARTMENT_OTHER): Payer: Self-pay

## 2018-03-17 ENCOUNTER — Ambulatory Visit (HOSPITAL_BASED_OUTPATIENT_CLINIC_OR_DEPARTMENT_OTHER)
Admission: RE | Admit: 2018-03-17 | Discharge: 2018-03-17 | Disposition: A | Discharge status: Home / Self Care | Payer: Medicaid Other | Source: Ambulatory Visit | Attending: Emergency Medicine | Admitting: Emergency Medicine

## 2018-03-17 ENCOUNTER — Emergency Department (HOSPITAL_BASED_OUTPATIENT_CLINIC_OR_DEPARTMENT_OTHER)
Admission: EM | Admit: 2018-03-17 | Discharge: 2018-03-17 | Disposition: A | Discharge status: Home / Self Care | Payer: Medicaid Other | Attending: Emergency Medicine | Admitting: Emergency Medicine

## 2018-03-17 DIAGNOSIS — B9689 Other specified bacterial agents as the cause of diseases classified elsewhere: Secondary | ICD-10-CM

## 2018-03-17 DIAGNOSIS — F17200 Nicotine dependence, unspecified, uncomplicated: Secondary | ICD-10-CM | POA: Diagnosis not present

## 2018-03-17 DIAGNOSIS — N939 Abnormal uterine and vaginal bleeding, unspecified: Secondary | ICD-10-CM

## 2018-03-17 DIAGNOSIS — Z79899 Other long term (current) drug therapy: Secondary | ICD-10-CM | POA: Diagnosis not present

## 2018-03-17 DIAGNOSIS — N760 Acute vaginitis: Secondary | ICD-10-CM | POA: Insufficient documentation

## 2018-03-17 LAB — URINALYSIS, ROUTINE W REFLEX MICROSCOPIC
Glucose, UA: NEGATIVE mg/dL
Hgb urine dipstick: NEGATIVE
KETONES UR: NEGATIVE mg/dL
LEUKOCYTE UA: NEGATIVE
NITRITE: NEGATIVE
Protein, ur: NEGATIVE mg/dL
Specific Gravity, Urine: >1.03 — ABNORMAL HIGH (ref 1.005–1.030)
pH: 6 (ref 5.0–8.0)

## 2018-03-17 LAB — ABO/RH: ABO/RH(D): O POS

## 2018-03-17 LAB — WET PREP, GENITAL
Sperm: NONE SEEN
TRICH WET PREP: NONE SEEN
Yeast Wet Prep HPF POC: NONE SEEN

## 2018-03-17 LAB — HCG, QUANTITATIVE, PREGNANCY: hCG, Beta Chain, Quant, S: 1 m[IU]/mL (ref <5)

## 2018-03-17 MED ORDER — METRONIDAZOLE 500 MG PO TABS: 500.0000 mg | ORAL_TABLET | Freq: Two times a day (BID) | ORAL | 0 refills | Status: AC

## 2018-03-17 NOTE — ED Provider Notes (Signed)
[facility] Provider Note: Lowella Dell, MD, FACEP  CSN: [id number] MRN: [id number] ARRIVAL: 03/17/18 at 0444 ROOM: MH04/MH04   CHIEF COMPLAINT  Vaginal Bleeding   HISTORY OF PRESENT ILLNESS  03/17/18 5:03 AM Ariel Spears is a 23 y.o. female found out she was pregnant several weeks ago.  She is here with 4 days of pelvic cramping which is moderate in severity, vaginal spotting and pelvic pressure.  She was having right upper chest pain earlier radiating to her right shoulder but this is not present now.  Her chest pain was not made better or worse by anything such as movement or deep breathing.  She is concerned she may be having a miscarriage or an ectopic pregnancy.  She has had decreased oral intake due to nausea.    Past Medical History:  Diagnosis Date  . Attention deficit hyperactivity disorder (ADHD)   . Central auditory processing disorder   . Fibromyalgia   . GERD (gastroesophageal reflux disease)   . Hepatitis A   . Hypoglycemia   . Pneumonia   . Restless leg syndrome     History reviewed. No pertinent surgical history.  Family History  Problem Relation Age of Onset  . Cancer Other     Social History   Tobacco Use  . Smoking status: Current Every Day Smoker  . Smokeless tobacco: Never Used  Substance Use Topics  . Alcohol use: No  . Drug use: No    Prior to Admission medications   Medication Sig Start Date End Date Taking? Authorizing Provider  ALPRAZolam Prudy Feeler) 0.5 MG tablet Take 0.5 mg by mouth 2 (two) times daily as needed for anxiety.    [provider]  buprenorphine-naloxone (SUBOXONE) 8-2 MG SUBL SL tablet Place 1 tablet under the tongue daily.    [provider]  buPROPion (WELLBUTRIN XL) 150 MG 24 hr tablet Take 150 mg by mouth daily.    [provider]  cephALEXin (KEFLEX) 500 MG capsule Take 1 capsule (500 mg total) by mouth 4 (four) times daily. 09/06/12   Sciacca, Marissa, PA-C  citalopram  (CELEXA) 20 MG tablet Take 20 mg by mouth daily.    [provider]  cloNIDine (CATAPRES) 0.2 MG tablet Take 1 tablet (0.2 mg total) by mouth 3 (three) times daily. 10/20/13   Shirleen Schirmer, PA-C  dicyclomine (BENTYL) 20 MG tablet Take 1 tablet (20 mg total) by mouth 2 (two) times daily. 10/20/13   Shirleen Schirmer, PA-C  diphenhydrAMINE (BENADRYL) 25 MG tablet Take 1 tablet (25 mg total) by mouth every 6 (six) hours as needed for sleep. 10/26/13   Loren Racer, MD  escitalopram (LEXAPRO) 20 MG tablet Take 20 mg by mouth daily.    [provider]  HYDROcodone-acetaminophen (NORCO/VICODIN) 5-325 MG per tablet Take 1-2 tablets by mouth every 6 (six) hours as needed for moderate pain. 07/30/13   Vanetta Mulders, MD  Lisdexamfetamine Dimesylate (VYVANSE PO) Take by mouth.    [provider]  loratadine (CLARITIN) 10 MG tablet Take 1 tablet (10 mg total) by mouth daily. 01/20/16   Lyndal Pulley, MD  LORazepam (ATIVAN) 1 MG tablet Take 1 tablet (1 mg total) by mouth 3 (three) times daily as needed (for anxiety or restless legs). 04/28/13   Adalynd Donahoe, MD  metroNIDAZOLE (FLAGYL) 500 MG tablet Take 1 tablet (500 mg total) by mouth 2 (two) times daily. 12/17/12   Ariel Gamma, MD  naproxen (NAPROSYN) 500 MG tablet Take 1 tablet (  500 mg total) by mouth 2 (two) times daily. 09/06/12   Sciacca, Marissa, PA-C  naproxen (NAPROSYN) 500 MG tablet Take 1 tablet (500 mg total) by mouth 2 (two) times daily. 07/30/13   Vanetta Mulders, MD  nitrofurantoin, macrocrystal-monohydrate, (MACROBID) 100 MG capsule Take 1 capsule (100 mg total) by mouth 2 (two) times daily. X 7 days 04/28/13   Jahira Swiss, MD  ondansetron (ZOFRAN) 4 MG tablet Take 1 tablet (4 mg total) by mouth every 6 (six) hours. 10/20/13   Shirleen Schirmer, PA-C  penicillin v potassium (VEETID) 500 MG tablet Take 1 tablet (500 mg total) by mouth 4 (four) times daily. 07/30/13   Vanetta Mulders, MD    Allergies Sulfa  antibiotics   REVIEW OF SYSTEMS  Negative except as noted here or in the History of Present Illness.   PHYSICAL EXAMINATION  Initial Vital Signs Blood pressure (!) 150/104, pulse (!) 117, temperature 98.6 F (37 C), temperature source Oral, resp. rate 20, height 5' 7.5" (1.715 m), weight 104.3 kg, last menstrual period 12/12/2017, SpO2 99 %, unknown if currently breastfeeding.  Examination General: Well-developed, well-nourished female in no acute distress; appearance consistent with age of record HENT: normocephalic; atraumatic Eyes: pupils equal, round and reactive to light; extraocular muscles intact Neck: supple Heart: regular rate and rhythm Lungs: clear to auscultation bilaterally Chest: Nontender Abdomen: soft; nondistended; suprapubic tenderness; bowel sounds present GU: Normal external genitalia; no vaginal bleeding; physiologic appearing vaginal discharge; mild cervical motion tenderness and adnexal tenderness; abnormal vaginal odor Extremities: No deformity; full range of motion; pulses normal Neurologic: Awake, alert and oriented; motor function intact in all extremities and symmetric; no facial droop Skin: Warm and dry Psychiatric: Anxious   RESULTS  Summary of this visit's results, reviewed by myself:   EKG Interpretation  Date/Time:    Ventricular Rate:    PR Interval:    QRS Duration:   QT Interval:    QTC Calculation:   R Axis:     Text Interpretation:        Laboratory Studies: Results for orders placed or performed during the hospital encounter of 03/17/18 (from the past 24 hour(s))  hCG, quantitative, pregnancy     Status: None   Collection Time: 03/17/18  5:22 AM  Result Value Ref Range   hCG, Beta Chain, Quant, S 1 <5 mIU/mL  Urinalysis, Routine w reflex microscopic     Status: Abnormal   Collection Time: 03/17/18  5:22 AM  Result Value Ref Range   Color, Urine YELLOW YELLOW   APPearance CLOUDY (A) CLEAR   Specific Gravity, Urine >1.030 (H)  1.005 - 1.030   pH 6.0 5.0 - 8.0   Glucose, UA NEGATIVE NEGATIVE mg/dL   Hgb urine dipstick NEGATIVE NEGATIVE   Bilirubin Urine SMALL (A) NEGATIVE   Ketones, ur NEGATIVE NEGATIVE mg/dL   Protein, ur NEGATIVE NEGATIVE mg/dL   Nitrite NEGATIVE NEGATIVE   Leukocytes,Ua NEGATIVE NEGATIVE  Wet prep, genital     Status: Abnormal   Collection Time: 03/17/18  5:24 AM  Result Value Ref Range   Yeast Wet Prep HPF POC NONE SEEN NONE SEEN   Trich, Wet Prep NONE SEEN NONE SEEN   Clue Cells Wet Prep HPF POC PRESENT (A) NONE SEEN   WBC, Wet Prep HPF POC MODERATE (A) NONE SEEN   Sperm NONE SEEN    Imaging Studies: No results found.  ED COURSE and MDM  Nursing notes and initial vitals signs, including pulse oximetry, reviewed.  Vitals:  03/17/18 0458 03/17/18 0501  BP:  (!) 150/104  Pulse:  (!) 117  Resp:  20  Temp:  98.6 F (37 C)  TempSrc:  Oral  SpO2:  99%  Weight: 104.3 kg   Height: 5' 7.5" (1.715 m)    Patient's quantitative beta-hCG is not consistent with pregnancy.  Assuming the patient was pregnant this likely represents a miscarriage or intrauterine fetal demise.  We will have her return for a pelvic ultrasound later this morning.  We will also treat her for bacterial vaginosis.  PROCEDURES    ED DIAGNOSES     ICD-10-CM   1. BV (bacterial vaginosis) N76.0    B96.89   2. Vaginal bleeding N93.9        Emmit Oriley, Jonny RuizJohn, MD 03/17/18 [phone]

## 2018-03-17 NOTE — ED Triage Notes (Signed)
Pt states she found out she was pregnant a couple weeks ago and for the past few days she has been having abd cramping and started spotting on Friday  Pt states she continues to spot but denies active bleeding  Pt is anxious in words and actions

## 2018-03-17 NOTE — ED Provider Notes (Signed)
Patient returned this morning for pelvic ultrasound.  She was concerned about pregnancy and vaginal bleeding but her hCG not consistent with pregnancy.  Ultrasound shows no acute findings, some suggestion of adenomyosis.  I discussed findings with patient and importance of follow-up with OB/GYN regarding her symptoms.  She voiced understanding.   Ariel Spears, Ambrose Finland, MD 03/17/18 1531

## 2018-03-18 LAB — RPR: RPR: NONREACTIVE

## 2018-03-18 LAB — GC/CHLAMYDIA PROBE AMP (~~LOC~~) NOT AT ARMC
Chlamydia: NEGATIVE
Neisseria Gonorrhea: NEGATIVE

## 2018-03-18 LAB — HIV ANTIBODY (ROUTINE TESTING W REFLEX): HIV Screen 4th Generation wRfx: NONREACTIVE

## 2018-04-13 ENCOUNTER — Other Ambulatory Visit: Payer: Self-pay | Admitting: Oral Surgery

## 2018-11-30 ENCOUNTER — Emergency Department (HOSPITAL_COMMUNITY)
Admission: EM | Admit: 2018-11-30 | Discharge: 2018-12-01 | Payer: Medicaid Other | Attending: Emergency Medicine | Admitting: Emergency Medicine

## 2018-11-30 ENCOUNTER — Ambulatory Visit (HOSPITAL_COMMUNITY)
Admission: RE | Admit: 2018-11-30 | Discharge: 2018-11-30 | Disposition: A | Discharge status: Home / Self Care | Payer: Medicaid Other | Attending: Psychiatry | Admitting: Psychiatry

## 2018-11-30 ENCOUNTER — Encounter (HOSPITAL_COMMUNITY): Payer: Self-pay | Admitting: Emergency Medicine

## 2018-11-30 DIAGNOSIS — Z809 Family history of malignant neoplasm, unspecified: Secondary | ICD-10-CM | POA: Diagnosis not present

## 2018-11-30 DIAGNOSIS — R45851 Suicidal ideations: Secondary | ICD-10-CM | POA: Diagnosis not present

## 2018-11-30 DIAGNOSIS — F909 Attention-deficit hyperactivity disorder, unspecified type: Secondary | ICD-10-CM | POA: Diagnosis not present

## 2018-11-30 DIAGNOSIS — Z882 Allergy status to sulfonamides status: Secondary | ICD-10-CM | POA: Diagnosis not present

## 2018-11-30 DIAGNOSIS — F329 Major depressive disorder, single episode, unspecified: Secondary | ICD-10-CM | POA: Insufficient documentation

## 2018-11-30 DIAGNOSIS — F332 Major depressive disorder, recurrent severe without psychotic features: Secondary | ICD-10-CM | POA: Diagnosis not present

## 2018-11-30 DIAGNOSIS — Z0489 Encounter for examination and observation for other specified reasons: Secondary | ICD-10-CM | POA: Insufficient documentation

## 2018-11-30 DIAGNOSIS — F1910 Other psychoactive substance abuse, uncomplicated: Secondary | ICD-10-CM | POA: Diagnosis not present

## 2018-11-30 DIAGNOSIS — Z9119 Patient's noncompliance with other medical treatment and regimen: Secondary | ICD-10-CM | POA: Diagnosis not present

## 2018-11-30 DIAGNOSIS — Z046 Encounter for general psychiatric examination, requested by authority: Secondary | ICD-10-CM

## 2018-11-30 DIAGNOSIS — Z20828 Contact with and (suspected) exposure to other viral communicable diseases: Secondary | ICD-10-CM | POA: Insufficient documentation

## 2018-11-30 DIAGNOSIS — F419 Anxiety disorder, unspecified: Secondary | ICD-10-CM | POA: Insufficient documentation

## 2018-11-30 DIAGNOSIS — F17200 Nicotine dependence, unspecified, uncomplicated: Secondary | ICD-10-CM | POA: Insufficient documentation

## 2018-11-30 DIAGNOSIS — F1320 Sedative, hypnotic or anxiolytic dependence, uncomplicated: Secondary | ICD-10-CM | POA: Insufficient documentation

## 2018-11-30 DIAGNOSIS — F418 Other specified anxiety disorders: Secondary | ICD-10-CM | POA: Insufficient documentation

## 2018-11-30 DIAGNOSIS — E162 Hypoglycemia, unspecified: Secondary | ICD-10-CM | POA: Insufficient documentation

## 2018-11-30 LAB — COMPREHENSIVE METABOLIC PANEL
ALT: 23 U/L (ref 0–44)
AST: 26 U/L (ref 15–41)
Albumin: 3.8 g/dL (ref 3.5–5.0)
Alkaline Phosphatase: 96 U/L (ref 38–126)
Anion gap: 6 (ref 5–15)
BUN: 13 mg/dL (ref 6–20)
CO2: 27 mmol/L (ref 22–32)
Calcium: 8.8 mg/dL — ABNORMAL LOW (ref 8.9–10.3)
Chloride: 106 mmol/L (ref 98–111)
Creatinine, Ser: 0.78 mg/dL (ref 0.44–1.00)
GFR calc Af Amer: >60 mL/min (ref >60)
GFR calc non Af Amer: >60 mL/min (ref >60)
Glucose, Bld: 95 mg/dL (ref 70–99)
Potassium: 3.5 mmol/L (ref 3.5–5.1)
Sodium: 139 mmol/L (ref 135–145)
Total Bilirubin: 0.9 mg/dL (ref 0.3–1.2)
Total Protein: 6.4 g/dL — ABNORMAL LOW (ref 6.5–8.1)

## 2018-11-30 LAB — CBC WITH DIFFERENTIAL/PLATELET
Abs Immature Granulocytes: 0.01 10*3/uL (ref 0.00–0.07)
Basophils Absolute: 0 10*3/uL (ref 0.0–0.1)
Basophils Relative: 1 %
Eosinophils Absolute: 0.2 10*3/uL (ref 0.0–0.5)
Eosinophils Relative: 3 %
HCT: 41.6 % (ref 36.0–46.0)
Hemoglobin: 14.2 g/dL (ref 12.0–15.0)
Immature Granulocytes: 0 %
Lymphocytes Relative: 43 %
Lymphs Abs: 2.5 10*3/uL (ref 0.7–4.0)
MCH: 33.8 pg (ref 26.0–34.0)
MCHC: 34.1 g/dL (ref 30.0–36.0)
MCV: 99 fL (ref 80.0–100.0)
Monocytes Absolute: 0.4 10*3/uL (ref 0.1–1.0)
Monocytes Relative: 6 %
Neutro Abs: 2.8 10*3/uL (ref 1.7–7.7)
Neutrophils Relative %: 47 %
Platelets: 233 10*3/uL (ref 150–400)
RBC: 4.2 MIL/uL (ref 3.87–5.11)
RDW: 11.9 % (ref 11.5–15.5)
WBC: 5.8 10*3/uL (ref 4.0–10.5)
nRBC: 0 % (ref 0.0–0.2)

## 2018-11-30 LAB — I-STAT BETA HCG BLOOD, ED (MC, WL, AP ONLY): I-stat hCG, quantitative: <5 m[IU]/mL (ref <5)

## 2018-11-30 LAB — RAPID URINE DRUG SCREEN, HOSP PERFORMED
Amphetamines: POSITIVE — AB
Barbiturates: NOT DETECTED
Benzodiazepines: POSITIVE — AB
Cocaine: POSITIVE — AB
Opiates: NOT DETECTED
Tetrahydrocannabinol: NOT DETECTED

## 2018-11-30 LAB — ACETAMINOPHEN LEVEL: Acetaminophen (Tylenol), Serum: <10 ug/mL — ABNORMAL LOW (ref 10–30)

## 2018-11-30 LAB — ETHANOL: Alcohol, Ethyl (B): <10 mg/dL (ref <10)

## 2018-11-30 LAB — SARS CORONAVIRUS 2 BY RT PCR (HOSPITAL ORDER, PERFORMED IN ~~LOC~~ HOSPITAL LAB): SARS Coronavirus 2: NEGATIVE

## 2018-11-30 MED ORDER — LOPERAMIDE HCL 2 MG PO CAPS: 2.0000 mg | ORAL_CAPSULE | ORAL | Status: DC | PRN

## 2018-11-30 MED ORDER — NICOTINE 21 MG/24HR TD PT24: 21.0000 mg | MEDICATED_PATCH | Freq: Every day | TRANSDERMAL | Status: DC

## 2018-11-30 MED ORDER — NAPROXEN 500 MG PO TABS: 500.0000 mg | ORAL_TABLET | Freq: Two times a day (BID) | ORAL | Status: DC | PRN

## 2018-11-30 MED ORDER — ONDANSETRON 4 MG PO TBDP: 4.0000 mg | ORAL_TABLET | Freq: Four times a day (QID) | ORAL | Status: DC | PRN

## 2018-11-30 MED ORDER — DICYCLOMINE HCL 20 MG PO TABS: 20.0000 mg | ORAL_TABLET | Freq: Four times a day (QID) | ORAL | Status: DC | PRN

## 2018-11-30 MED ORDER — ALUM & MAG HYDROXIDE-SIMETH 200-200-20 MG/5ML PO SUSP: 30.0000 mL | Freq: Four times a day (QID) | ORAL | Status: DC | PRN

## 2018-11-30 MED ORDER — HYDROXYZINE HCL 25 MG PO TABS: 25.0000 mg | ORAL_TABLET | Freq: Four times a day (QID) | ORAL | Status: DC | PRN

## 2018-11-30 MED ORDER — ONDANSETRON HCL 4 MG PO TABS: 4.0000 mg | ORAL_TABLET | Freq: Three times a day (TID) | ORAL | Status: DC | PRN

## 2018-11-30 MED ORDER — METHOCARBAMOL 500 MG PO TABS: 500.0000 mg | ORAL_TABLET | Freq: Three times a day (TID) | ORAL | Status: DC | PRN

## 2018-11-30 NOTE — BH Assessment (Signed)
11/30/18: Case was staffed with Marcello Moores NP who recommended a inpatient admission to assist with stabilization. Patient referred to to the following hospitals. Pending review:  [facility]   [facility]  [facility] Details   [facility]   [facility]   [facility]  [facility]   [facility]   [facility]

## 2018-11-30 NOTE — BH Assessment (Signed)
Assessment Note  Ariel Spears is an 23 y.o. female that presents this date with IVC. Per IVC patient has been diagnosed with ADHD depression and anxiety. [name] has not been taking medications and has been abusing multiple substances. [name] got into a verbal altercation earlier this date with her boyfriend and does not remember making statements of self harm. [name] stated to family members that she wanted to kill herself. Patient is observed to be impaired at the time assessment and renders limited history. Patient finds it difficult to stay awake. Patient denies content of IVC although her memory is recent impaired as patient displays active thought blocking. Patient reports a SA history to include daily use of Suboxone and Xanax for the last five years. Patient renders limited information in reference to amounts or dosage. Patient states she was diagnosed with depression and post partum depression over five years ago although cannot recall provider information. Patient states she is currently not prescribed any medication for symptom management. Patient denies any prior attempts or gestures at self harm. Patient cannot currently identify any mental health symptoms. Per chart review patient [facility] when she presented with depressive symptoms. Patient did not meet inpatient criteria at that time. Patient denies S/I at the time of assessment although reports she had told her mother earlier that she had a plan to overdose on her medications. Patient denies any H/I or AVH. Ariel Fushomas NP also evlauted patient and notes, [facility], involuntarily. During this evaluation, patient is very disoriented, incoherent, and is clearly impaired and under the influence. She states that she has been using both suboxone and xanax (1 or 2 bars daily) which she purchases off the street. She reports that she has a history of opiate addiction and she was prescribed  suboxne by an outpatient provider however, she could no longer get it so she started buying it off the street. She reports she has not sleep in 24 hours. She denies SI, HI or AVH however, admits that she told her mother that she wanted to kill herself but," not by overdose."  Per IVC. respondent has a history of ADHD, depression and anxiety. [name] is not sleeping or respondent is not sleeping or tending to her personal hygiene. This morning, respondent had an argument with her boyfriend and does not remember anything. When patient mother arrived to teahouse respondent was found incoherent and had apparently overdosed on narcotics. [name] stated to the family that she was going to kill herself and that she wanted to overdose. Case was staffed with Ariel Fushomas NP who recommended a inpatient admission to assist with stabilization.      Diagnosis: F33.2 MDD recurrent without psychotic features, severe Polysubstance abuse  Past Medical History:  Past Medical History:  Diagnosis Date  . Attention deficit hyperactivity disorder (ADHD)   . Central auditory processing disorder   . Fibromyalgia   . GERD (gastroesophageal reflux disease)   . Hepatitis A   . Hypoglycemia   . Pneumonia   . Restless leg syndrome     No past surgical history on file.  Family History:  Family History  Problem Relation Age of Onset  . Cancer Other     Social History:  reports that she has been smoking. She has never used smokeless tobacco. She reports that she does not drink alcohol or use drugs.  Additional Social History:  Alcohol / Drug Use Pain Medications: See MAR Prescriptions: See MAR Over the  Counter: See MAR History of alcohol / drug use?: Yes Longest period of sobriety (when/how long): Unknown Negative Consequences of Use: (Denies) Withdrawal Symptoms: (Denies) Substance #1 Name of Substance 1: Xanax 1 - Age of First Use: UTA 1 - Amount (size/oz): Varies 1 - Frequency: Varies 1 - Duration:  Ongoing 1 - Last Use / Amount: 11/30/18 10 mg Substance #2 Name of Substance 2: Suboxone 2 - Age of First Use: UTA 2 - Amount (size/oz): UTA 2 - Frequency: Varies 2 - Duration: Ongoing 2 - Last Use / Amount: 11/30/18 unknown amount  CIWA: CIWA-Ar BP: 106/67 Pulse Rate: 80 COWS:    Allergies:  Allergies  Allergen Reactions  . Sulfa Antibiotics Rash    Home Medications: (Not in a hospital admission)   OB/GYN Status:  No LMP recorded. (Menstrual status: Irregular Periods).  General Assessment Data Location of Assessment: [facility] Assessment Services TTS Assessment: In system Is this a Tele or Face-to-Face Assessment?: Face-to-Face Is this an Initial Assessment or a Re-assessment for this encounter?: Initial Assessment Patient Accompanied by:: N/A Language Other than English: No Living Arrangements: Other (Comment) What gender do you identify as?: Female Marital status: Single Pregnancy Status: No Living Arrangements: Children Can pt return to current living arrangement?: Yes Admission Status: Involuntary Petitioner: Family member Is patient capable of signing voluntary admission?: Yes Referral Source: Self/Family/Friend Insurance type: Medicaid  Medical Screening Exam ([place]) Medical Exam completed: Yes  Crisis Care Plan Living Arrangements: Children Name of Psychiatrist: None Name of Therapist: None  Education Status Is patient currently in school?: No Is the patient employed, unemployed or receiving disability?: Unemployed  Risk to self with the past 6 months Suicidal Ideation: Yes-Currently Present Has patient been a risk to self within the past 6 months prior to admission? : No Suicidal Intent: Yes-Currently Present Has patient had any suicidal intent within the past 6 months prior to admission? : No Is patient at risk for suicide?: Yes Suicidal Plan?: Yes-Currently Present Has patient had any suicidal plan within the past 6 months prior to  admission? : No Specify Current Suicidal Plan: Overdose Access to Means: Yes Specify Access to Suicidal Means: Pt has medications What has been your use of drugs/alcohol within the last 12 months?: Current use  Previous Attempts/Gestures: No How many times?: 0 Other Self Harm Risks: (Excessive SA use) Triggers for Past Attempts: (NA) Intentional Self Injurious Behavior: None Family Suicide History: No Recent stressful life event(s): Other (Comment)(Excessive SA use) Persecutory voices/beliefs?: No Depression: Yes Depression Symptoms: (Vague in reference to symptoms) Substance abuse history and/or treatment for substance abuse?: No Suicide prevention information given to non-admitted patients: Not applicable  Risk to Others within the past 6 months Homicidal Ideation: No Does patient have any lifetime risk of violence toward others beyond the six months prior to admission? : No Thoughts of Harm to Others: No Current Homicidal Intent: No Current Homicidal Plan: No Access to Homicidal Means: No Identified Victim: NA History of harm to others?: No Assessment of Violence: None Noted Violent Behavior Description: NA Does patient have access to weapons?: No Criminal Charges Pending?: No Does patient have a court date: No Is patient on probation?: No  Psychosis Hallucinations: None noted Delusions: None noted  Mental Status Report Appearance/Hygiene: Unremarkable Eye Contact: Poor Motor Activity: Freedom of movement Speech: Slow, Slurred Level of Consciousness: Drowsy Mood: Depressed Affect: Flat Anxiety Level: Minimal Thought Processes: Thought Blocking Judgement: Impaired Orientation: Unable to assess Obsessive Compulsive Thoughts/Behaviors: None  Cognitive Functioning Concentration:  Decreased Memory: Unable to Assess Is patient IDD: No Insight: Unable to Assess Impulse Control: Unable to Assess Appetite: (UTA) Have you had any weight changes? : (UTA) Sleep:  (UTA) Total Hours of Sleep: (UTA) Vegetative Symptoms: None  ADLScreening [facility] Assessment Services) Patient's cognitive ability adequate to safely complete daily activities?: Yes Patient able to express need for assistance with ADLs?: Yes Independently performs ADLs?: Yes (appropriate for developmental age)  Prior Inpatient Therapy Prior Inpatient Therapy: No  Prior Outpatient Therapy Prior Outpatient Therapy: No Does patient have an ACCT team?: No Does patient have Intensive In-House Services?  : No Does patient have [facility] services? : No Does patient have P4CC services?: No  ADL Screening (condition at time of admission) Patient's cognitive ability adequate to safely complete daily activities?: Yes Is the patient deaf or have difficulty hearing?: No Does the patient have difficulty seeing, even when wearing glasses/contacts?: No Does the patient have difficulty concentrating, remembering, or making decisions?: No Patient able to express need for assistance with ADLs?: Yes Does the patient have difficulty dressing or bathing?: No Independently performs ADLs?: Yes (appropriate for developmental age) Does the patient have difficulty walking or climbing stairs?: No Weakness of Legs: None Weakness of Arms/Hands: None  Home Assistive Devices/Equipment Home Assistive Devices/Equipment: None  Therapy Consults (therapy consults require a physician order) PT Evaluation Needed: No OT Evalulation Needed: No SLP Evaluation Needed: No Abuse/Neglect Assessment (Assessment to be complete while patient is alone) Abuse/Neglect Assessment Can Be Completed: Yes Physical Abuse: Denies Verbal Abuse: Denies Sexual Abuse: Denies Exploitation of patient/patient's resources: Denies Self-Neglect: Denies Values / Beliefs Cultural Requests During Hospitalization: None Spiritual Requests During Hospitalization: None Consults Spiritual Care Consult Needed: No Social Work Consult Needed:  No Merchant navy officer (For Healthcare) Does Patient Have a Medical Advance Directive?: No Would patient like information on creating a medical advance directive?: No - Patient declined          Disposition: Case was staffed with Ariel Fus NP who recommended a inpatient admission to assist with stabilization.   Disposition Initial Assessment Completed for this Encounter: Yes Disposition of Patient: Admit Type of inpatient treatment program: Adult Patient refused recommended treatment: No  On Site Evaluation by:   Reviewed with Physician:    Alfredia Ferguson 11/30/2018 3:36 PM

## 2018-11-30 NOTE — BH Assessment (Signed)
[place] Assessment Progress Note  Case was staffed with Marcello Moores NP who recommended a inpatient admission to assist with stabilization.

## 2018-11-30 NOTE — H&P (Addendum)
Behavioral Health Medical Screening Exam  Ariel Spears is an 23 y.o. female.who presents to [facility] as a walk-in, involuntarily. During this evaluation, patient is very disoriented, incoherent, and is clearly impaired and under the influence. She states that she has been using both suboxone and xanax (1 or 2 bars daily) which she purchases off the street. She reports that she has a history of opiate addiction and she was prescribed suboxne by an outpatient provider however, she could no longer get it so she started buying it off the street. She reports she has not sleep in 24 hours. She denies SI, HI or AVH however, admits that she told her mother that she wanted to kill herself but," not by overdose."    Per IVC. respondent has a history of ADHD, depression and anxiety. [name] is not sleeping or respondent is not sleeping or tending to her personal hygiene. This morning, respondent had an argument with her boyfriend and does not remember anything. When patient mother arrived to teahouse respondent was found incoherent and had apparently overdosed on narcotics. [name] stated to the family that she was going to kill herself and that she wanted to overdose.   Total Time spent with patient: 15 minutes  Psychiatric Specialty Exam: Physical Exam  Vitals reviewed.   Review of Systems  Psychiatric/Behavioral: Positive for substance abuse.    Blood pressure 106/67, pulse 80, temperature 97.7 F (36.5 C), temperature source Oral, resp. rate 18, SpO2 98 %.There is no height or weight on file to calculate BMI.  General Appearance: Fairly Groomed  Eye Contact:  Fair  Speech:  Slurred  Volume:  Decreased  Mood:  Anxious  Affect:  Tearful  Thought Process:  Disorganized  Orientation:  Full (Time, Place, and Person)  Thought Content:  Logical  Suicidal Thoughts:  No  Homicidal Thoughts:  No  Memory:  Immediate;   Fair Recent;   Fair  Judgement:  Impaired  Insight:  Lacking  Psychomotor  Activity:  Normal  Concentration: Concentration: Poor and Attention Span: Poor  Recall:  Poor  Fund of Knowledge:Poor  Language: Good  Akathisia:  NA  Handed:  Right  AIMS (if indicated):     Assets:  Social Support  Sleep:       Musculoskeletal: Strength & Muscle Tone: within normal limits Gait & Station: normal Patient leans: N/A  Blood pressure 106/67, pulse 80, temperature 97.7 F (36.5 C), temperature source Oral, resp. rate 18, SpO2 98 %.  Recommendations:  Due to reports of narcotic overdose as noted in the IVC, patient requires medical clearance and will be transported to [facility] ED. Because she is so disoriented  and under the influence she will be reassessed by psychiatry once medically cleared, additional collateral information may be collected and a dispostion will be made.   Mordecai Maes, NP 11/30/2018, 3:20 PM

## 2018-11-30 NOTE — ED Notes (Signed)
Call from triage nurse [facility] has been accepted to their all women's unit address [place], [place]. [address] Ph# [phone]  Message left at [facility] for transport [phone]

## 2018-11-30 NOTE — BH Assessment (Signed)
11/30/18: Per Maryelizabeth Rowan, RN, Patient accepted to [facility] for admission 12/01/2018. The accepting provider is Dr. Peter Garter. Nursing report [phone]. Upon arrival patient will need to present to the Main Entrance of the Emergency Department for registration. Patient is involuntary and will be transported by sheriff. Patient's nurse/Bobby aware of disposition.

## 2018-11-30 NOTE — ED Provider Notes (Signed)
[facility]-EMERGENCY DEPT Provider Note   CSN: [id number] Arrival date & time: 11/30/18  1545     History   Chief Complaint Chief Complaint  Patient presents with  . IVC    HPI Ariel BeersChelsea C Spears is a 23 y.o. female with history of ADHD, fibromyalgia, GERD, restless leg syndrome [facility] under [facility].  Per IVC the patient has a history of ADHD, depression, and anxiety.  She is not sleeping or attending to her personal hygiene.  Apparently the patient overdosed on narcotics earlier today after having an argument with her significant other and reported to her mother that she was going to kill herself and that she wanted to overdose.  [facility] the patient was disoriented, incoherent, clearly impaired and under the influence.  She has reportedly been using both Suboxone and Xanax which she has been purchasing off the street.  She reportedly has not slept in 24 hours.  She apparently told her mother that she wanted to kill herself "not by overdose ".  On my assessment she is sitting upright, alert and oriented to person place time and events.  She is eating a meal tray and sipping on fluids.  When asked what brought her to the emergency department she states "I do not want to talk about that ".  She will not answer any further questions but does tell me that she has no medical complaints including chest pain, shortness of breath, fevers, nausea, or vomiting.  She denies any pain.     The history is provided by the patient.    Past Medical History:  Diagnosis Date  . Attention deficit hyperactivity disorder (ADHD)   . Central auditory processing disorder   . Fibromyalgia   . GERD (gastroesophageal reflux disease)   . Hepatitis A   . Hypoglycemia   . Pneumonia   . Restless leg syndrome     There are no active problems to display for this patient.   History reviewed. No pertinent surgical history.   OB History    Gravida  2   Para      Term      Preterm      AB      Living        SAB      TAB      Ectopic      Multiple      Live Births               Home Medications    Prior to Admission medications   Medication Sig Start Date End Date Taking? Authorizing Provider  ALPRAZolam Prudy Feeler(XANAX) 0.5 MG tablet Take 0.5 mg by mouth 2 (two) times daily as needed for anxiety.    [provider]  buprenorphine-naloxone (SUBOXONE) 8-2 MG SUBL SL tablet Place 1 tablet under the tongue daily.    [provider]  buPROPion (WELLBUTRIN XL) 150 MG 24 hr tablet Take 150 mg by mouth daily.    [provider]  citalopram (CELEXA) 20 MG tablet Take 20 mg by mouth daily.    [provider]  cloNIDine (CATAPRES) 0.1 MG tablet Take 0.1 mg by mouth at bedtime. 09/03/18   [provider]  escitalopram (LEXAPRO) 20 MG tablet Take 20 mg by mouth daily.    [provider]  gabapentin (NEURONTIN) 100 MG capsule Take 100 mg by mouth 3 (three) times daily. 09/03/18   [provider]  Lisdexamfetamine Dimesylate (VYVANSE PO) Take by mouth.    [provider]  metroNIDAZOLE (FLAGYL) 500 MG tablet Take 1 tablet (500 mg total) by mouth 2 (two) times daily. 03/17/18   Molpus, John, MD  SUBOXONE 4-1 MG FILM Place 1 strip under the tongue 2 (two) times daily. 09/21/18   [provider]  VYVANSE 50 MG capsule Take 50 mg by mouth daily. 10/26/18   [provider]    Family History Family History  Problem Relation Age of Onset  . Cancer Other     Social History Social History   Tobacco Use  . Smoking status: Current Every Day Smoker  . Smokeless tobacco: Never Used  Substance Use Topics  . Alcohol use: No  . Drug use: Yes    Comment: "mulitple substances"      Allergies   Sulfa antibiotics   Review of Systems Review of Systems  Constitutional: Negative for fever.  Respiratory: Negative for shortness of breath.    Cardiovascular: Negative for chest pain.  Gastrointestinal: Negative for abdominal pain, nausea and vomiting.  Psychiatric/Behavioral: Positive for dysphoric mood, sleep disturbance and suicidal ideas.  All other systems reviewed and are negative.    Physical Exam Updated Vital Signs BP 103/68 (BP Location: Right Arm)   Pulse 60   Temp 97.7 F (36.5 C) (Oral)   Resp 18   SpO2 100%   Physical Exam Vitals signs and nursing note reviewed.  Constitutional:      General: She is not in acute distress.    Appearance: She is well-developed.     Comments: Sitting upright in bed, eating meal tray  HENT:     Head: Normocephalic and atraumatic.  Eyes:     General:        Right eye: No discharge.        Left eye: No discharge.     Conjunctiva/sclera: Conjunctivae normal.  Neck:     Vascular: No JVD.     Trachea: No tracheal deviation.  Cardiovascular:     Rate and Rhythm: Normal rate and regular rhythm.     Pulses: Normal pulses.     Heart sounds: Normal heart sounds.  Pulmonary:     Effort: Pulmonary effort is normal.     Breath sounds: Normal breath sounds.  Abdominal:     General: Abdomen is flat. Bowel sounds are normal. There is no distension.     Palpations: Abdomen is soft.     Tenderness: There is no abdominal tenderness. There is no guarding or rebound.  Skin:    General: Skin is warm and dry.     Findings: No erythema.  Neurological:     Mental Status: She is alert and oriented to person, place, and time.  Psychiatric:        Mood and Affect: Affect is blunt.        Speech: Speech normal.        Behavior: Behavior is withdrawn.        Thought Content: Thought content includes suicidal ideation. Thought content does not include homicidal ideation. Thought content includes suicidal plan. Thought content does not include homicidal plan.     Comments: Does not appear to be responding to internal stimuli at this time.  Somewhat cooperative.  Makes poor eye contact.       ED Treatments / Results  Labs (all labs ordered are listed, but only abnormal results are displayed) Labs Reviewed  COMPREHENSIVE METABOLIC PANEL - Abnormal; Notable for the  following components:      Result Value   Calcium 8.8 (*)    Total Protein 6.4 (*)    All other components within normal limits  RAPID URINE DRUG SCREEN, HOSP PERFORMED - Abnormal; Notable for the following components:   Cocaine POSITIVE (*)    Benzodiazepines POSITIVE (*)    Amphetamines POSITIVE (*)    All other components within normal limits  ACETAMINOPHEN LEVEL - Abnormal; Notable for the following components:   Acetaminophen (Tylenol), Serum <10 (*)    All other components within normal limits  SARS CORONAVIRUS 2 BY RT PCR (HOSPITAL ORDER, [place] [facility])  ETHANOL  CBC WITH DIFFERENTIAL/PLATELET  I-STAT BETA HCG BLOOD, ED ([facility], [facility], [facility] ONLY)    EKG EKG Interpretation  Date/Time:  Monday November 30 2018 17:08:04 EST Ventricular Rate:  62 PR Interval:  122 QRS Duration: 94 QT Interval:  430 QTC Calculation: 436 R Axis:   84 Text Interpretation: Sinus rhythm with marked sinus arrhythmia Otherwise normal ECG No old tracing to compare Confirmed by Dorie Rank [phone]) on 11/30/2018 5:11:27 PM   Radiology No results found.  Procedures Procedures (including critical care time)  Medications Ordered in ED Medications  dicyclomine (BENTYL) tablet 20 mg (has no administration in time range)  hydrOXYzine (ATARAX/VISTARIL) tablet 25 mg (has no administration in time range)  loperamide (IMODIUM) capsule 2-4 mg (has no administration in time range)  methocarbamol (ROBAXIN) tablet 500 mg (has no administration in time range)  naproxen (NAPROSYN) tablet 500 mg (has no administration in time range)  ondansetron (ZOFRAN) tablet 4 mg (has no administration in time range)  nicotine (NICODERM CQ - dosed in mg/24 hours) patch 21 mg (21 mg Transdermal Not Given 11/30/18 1855)  alum & mag  hydroxide-simeth (MAALOX/MYLANTA) 200-200-20 MG/5ML suspension 30 mL (has no administration in time range)     Initial Impression / Assessment and Plan / ED Course  I have reviewed the triage vital signs and the nursing notes.  Pertinent labs & imaging results that were available during my care of the patient were reviewed by me and considered in my medical decision making (see chart for details).        Patient presenting for medical clearance, currently under IVC.  She was seen and evaluated at [facility] earlier today and was sent for medical clearance.  She is afebrile, vital signs are stable.  She is nontoxic in appearance.  She is neurovascularly intact with no focal neuro deficits on cursory examination.  She does not wish to answer questions regarding what brought her to the emergency department today but she denies any pain and physical examination is reassuring.  She is alert, tolerating p.o. food and fluids and oriented to person place time and events.  Screening labs reviewed by me show no leukocytosis, no anemia, no metabolic derangements, no renal insufficiency.  Her UDS is positive for cocaine, benzodiazepines, and amphetamines.  EKG shows normal sinus rhythm.  Her Covid test is negative.  She is medically cleared for psychiatric placement at this time.  Final Clinical Impressions(s) / ED Diagnoses   Final diagnoses:  Involuntary commitment  Polysubstance abuse [facility])    ED Discharge Orders    None       Debroah Baller 11/30/18 Rae Roam, MD 12/01/18 [phone]

## 2018-11-30 NOTE — ED Triage Notes (Signed)
Patient here via EMS from [facility]. Reports abusing multiple substances. Reports that patient cannot stay awake during assessment and needs to be evaluated. Lethargic.

## 2018-12-01 NOTE — ED Notes (Signed)
Attempted to call report to [facility]. No answer at this time.

## 2020-01-23 IMAGING — US US PELVIS COMPLETE TRANSABD/TRANSVAG W DUPLEX
1 series · 13 of 25 positions shown · non-contrast
Comparison: None.

CLINICAL DATA: Abnormal uterine bleeding.

EXAM:
TRANSABDOMINAL AND TRANSVAGINAL ULTRASOUND OF PELVIS
DOPPLER ULTRASOUND OF OVARIES
TECHNIQUE: Both transabdominal and transvaginal ultrasound examinations of the
pelvis were performed. Transabdominal technique was performed for
global imaging of the pelvis including uterus, ovaries, adnexal
regions, and pelvic cul-de-sac.
It was necessary to proceed with endovaginal exam following the
transabdominal exam to visualize the adnexa.. Color and duplex
Doppler ultrasound was utilized to evaluate blood flow to the
ovaries.

[Series 1: us pelvis complete transabd/transvag w duplex · 0.24mm/px · 13 of 90 slices shown]
[im 1/90]
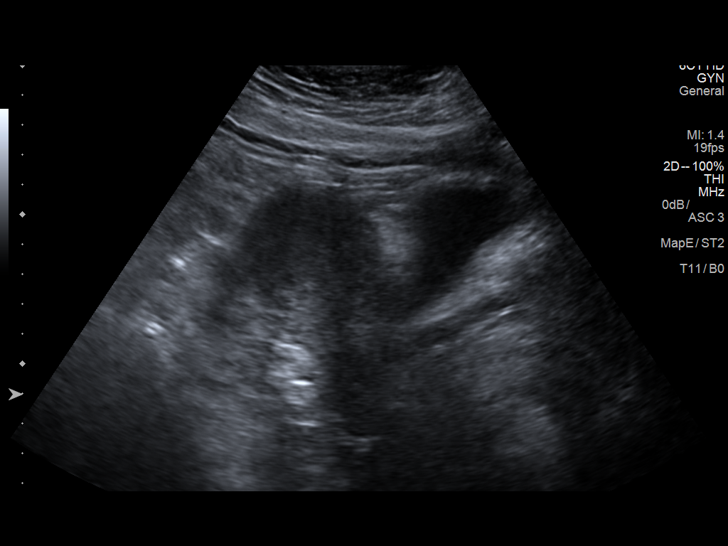
[im 8/90]
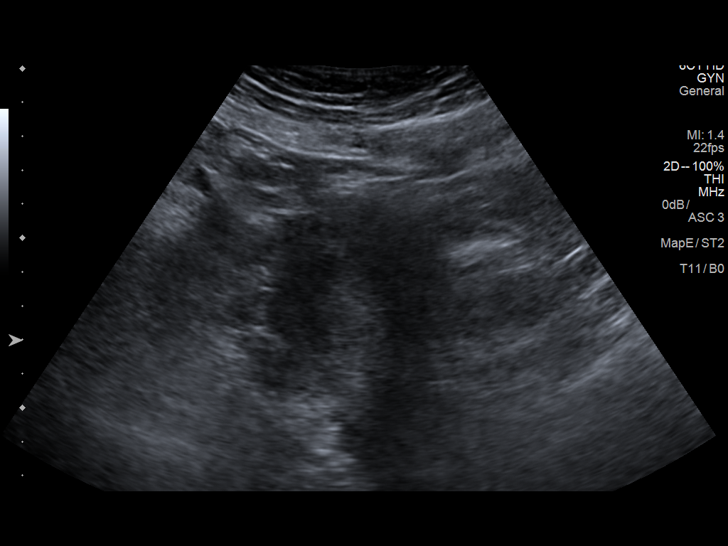
[im 15/90]
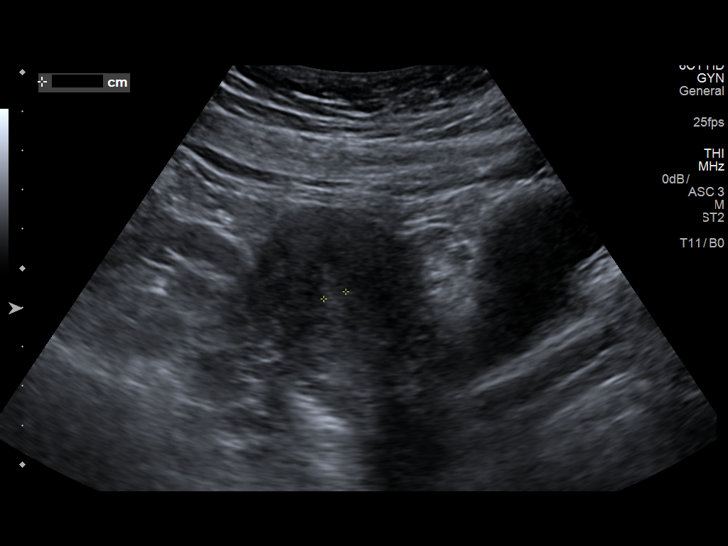
[im 23/90]
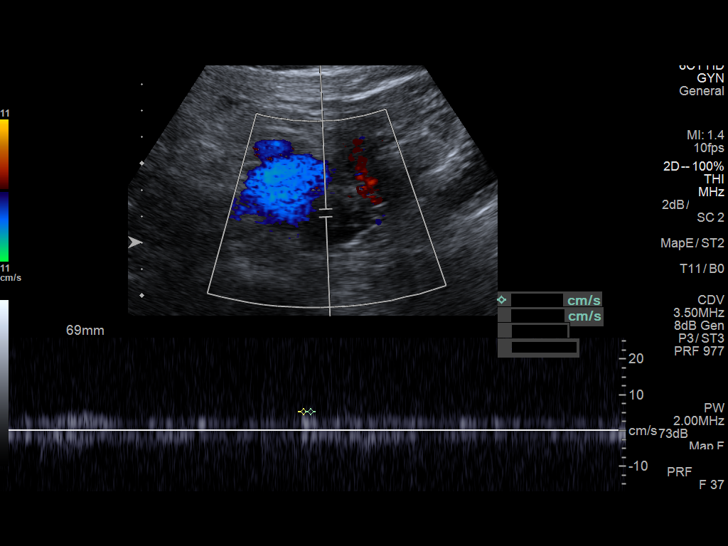
[im 30/90]
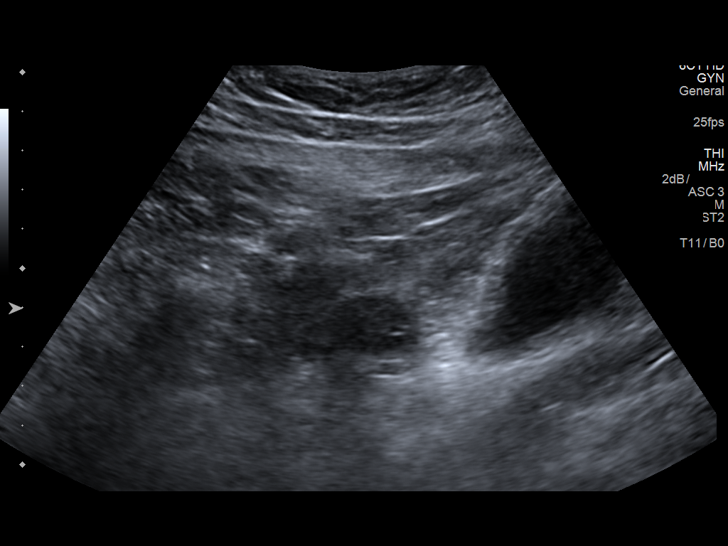
[im 38/90]
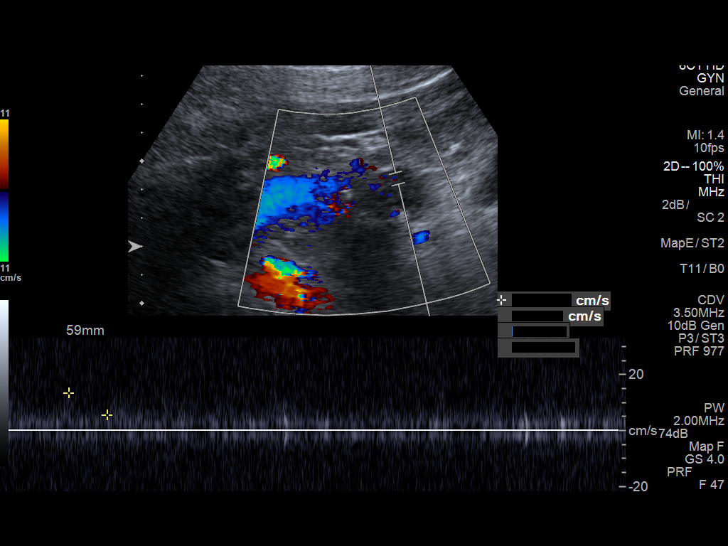
[im 45/90]
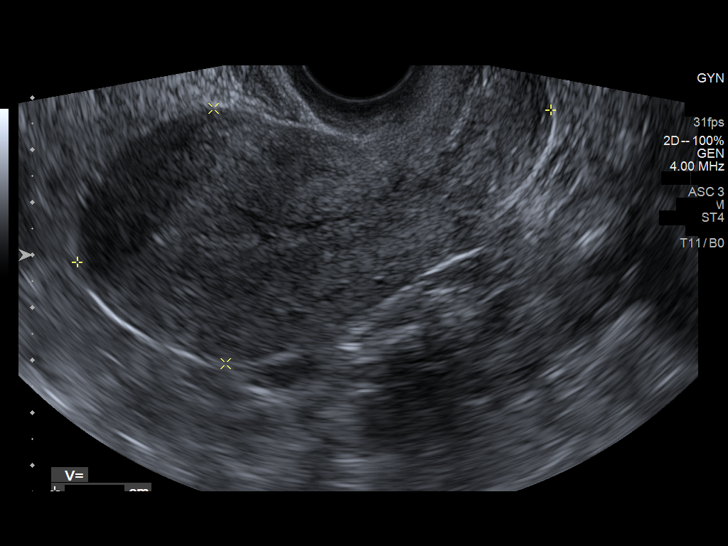
[im 52/90]
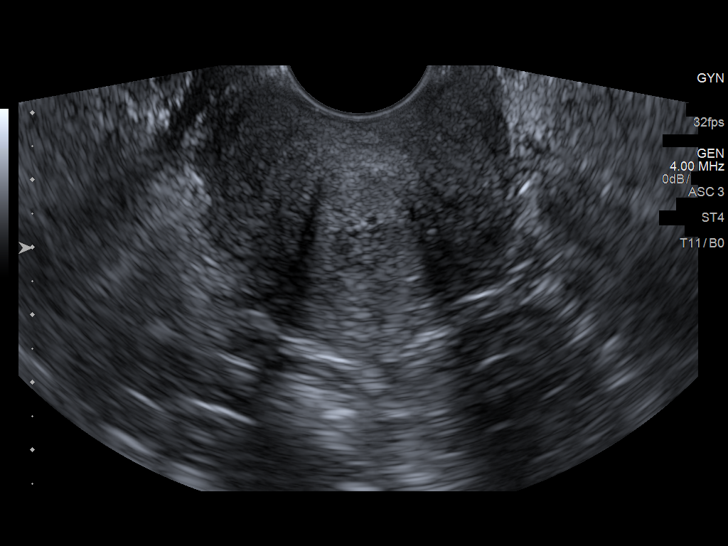
[im 60/90]
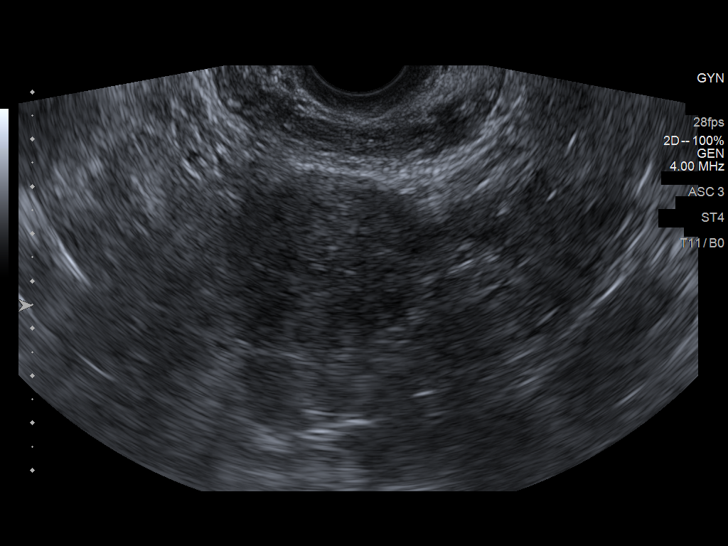
[im 67/90]
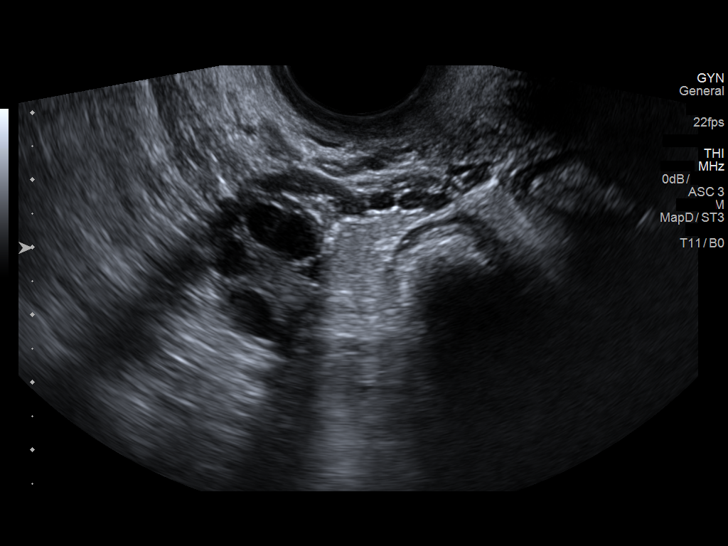
[im 75/90]
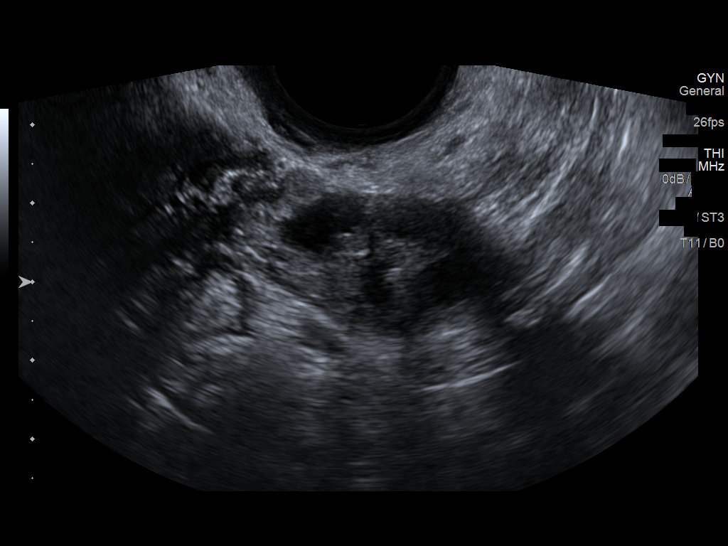
[im 82/90]
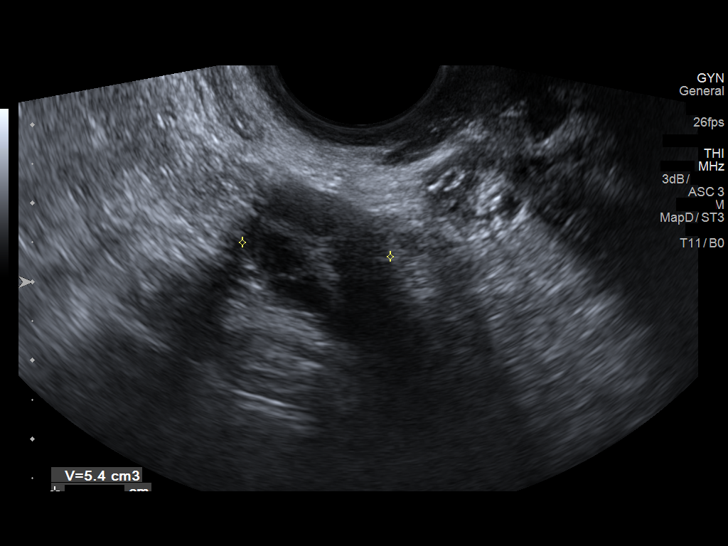
[im 90/90]
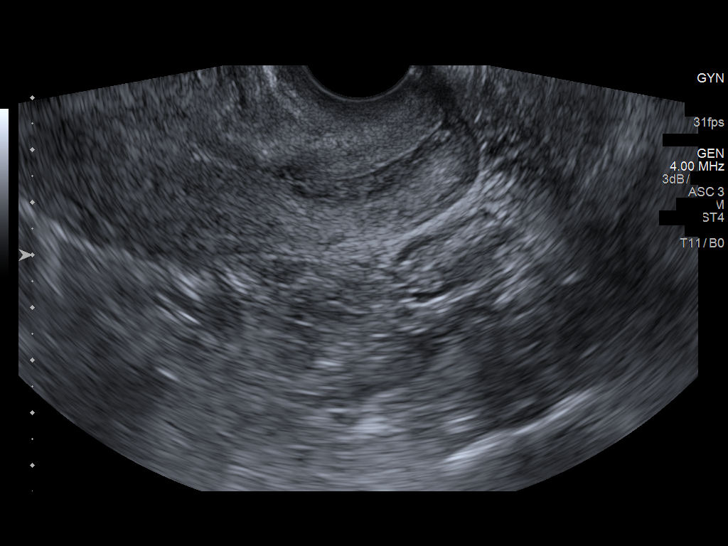

[13 of 25 positions shown; findings below may reference images not displayed]

FINDINGS: Uterus

Measurements: 9.5 x 4.9 x 5.1 cm = volume: 122 mL. No fibroid or
other focal abnormality. Echotexture is mildly heterogeneous and
there are small cystic foci in the myometrium.

Endometrium

Thickness: 0.2 cm.  No focal abnormality visualized.

Right ovary

Measurements: 2.5 x 1.8 x 1.7 cm = volume: 4 mL. Normal
appearance/no adnexal mass.

Left ovary

Measurements: 3.0 x 1.8 x 1.9 cm = volume: 5 mL. Normal
appearance/no adnexal mass.

Pulsed Doppler evaluation of both ovaries demonstrates normal
low-resistance arterial and venous waveforms.

Other findings

No abnormal free fluid.
IMPRESSION: No acute abnormality. Mildly heterogeneous echotexture and small
cystic foci in the myometrium of the uterus are suggestive of
adenomyosis.
